# Patient Record
Sex: Female | Born: 1980 | Race: Black or African American | Hispanic: No | Marital: Married | Smoking: Never smoker
Health system: Southern US, Community
[De-identification: ages and names within clinical notes are randomized; demographics above are authoritative.]

## PROBLEM LIST (undated history)

## (undated) ENCOUNTER — Inpatient Hospital Stay (HOSPITAL_COMMUNITY): Payer: Self-pay

---

## 2003-04-23 ENCOUNTER — Emergency Department (HOSPITAL_COMMUNITY): Admission: EM | Admit: 2003-04-23 | Discharge: 2003-04-24 | Payer: Self-pay | Admitting: Emergency Medicine

## 2003-08-09 ENCOUNTER — Ambulatory Visit (HOSPITAL_COMMUNITY): Admission: RE | Admit: 2003-08-09 | Discharge: 2003-08-09 | Payer: Self-pay | Admitting: Certified Nurse Midwife

## 2003-10-05 ENCOUNTER — Inpatient Hospital Stay (HOSPITAL_COMMUNITY): Admission: AD | Admit: 2003-10-05 | Discharge: 2003-10-05 | Payer: Self-pay | Admitting: Obstetrics & Gynecology

## 2003-12-17 ENCOUNTER — Inpatient Hospital Stay (HOSPITAL_COMMUNITY): Admission: AD | Admit: 2003-12-17 | Discharge: 2003-12-17 | Payer: Self-pay | Admitting: Obstetrics & Gynecology

## 2004-01-07 ENCOUNTER — Observation Stay (HOSPITAL_COMMUNITY): Admission: AD | Admit: 2004-01-07 | Discharge: 2004-01-08 | Payer: Self-pay | Admitting: Obstetrics & Gynecology

## 2004-01-08 ENCOUNTER — Inpatient Hospital Stay (HOSPITAL_COMMUNITY): Admission: AD | Admit: 2004-01-08 | Discharge: 2004-01-11 | Payer: Self-pay | Admitting: Obstetrics & Gynecology

## 2004-09-09 ENCOUNTER — Emergency Department (HOSPITAL_COMMUNITY): Admission: EM | Admit: 2004-09-09 | Discharge: 2004-09-09 | Payer: Self-pay | Admitting: Emergency Medicine

## 2004-09-10 ENCOUNTER — Emergency Department (HOSPITAL_COMMUNITY): Admission: EM | Admit: 2004-09-10 | Discharge: 2004-09-10 | Payer: Self-pay | Admitting: Emergency Medicine

## 2004-09-12 ENCOUNTER — Ambulatory Visit (HOSPITAL_COMMUNITY): Admission: RE | Admit: 2004-09-12 | Discharge: 2004-09-12 | Payer: Self-pay | Admitting: Emergency Medicine

## 2005-04-03 ENCOUNTER — Emergency Department (HOSPITAL_COMMUNITY): Admission: EM | Admit: 2005-04-03 | Discharge: 2005-04-04 | Payer: Self-pay | Admitting: Emergency Medicine

## 2005-08-10 ENCOUNTER — Inpatient Hospital Stay (HOSPITAL_COMMUNITY): Admission: AD | Admit: 2005-08-10 | Discharge: 2005-08-10 | Payer: Self-pay | Admitting: Obstetrics & Gynecology

## 2005-09-20 ENCOUNTER — Emergency Department (HOSPITAL_COMMUNITY): Admission: EM | Admit: 2005-09-20 | Discharge: 2005-09-20 | Payer: Self-pay | Admitting: Emergency Medicine

## 2005-10-26 ENCOUNTER — Inpatient Hospital Stay (HOSPITAL_COMMUNITY): Admission: AD | Admit: 2005-10-26 | Discharge: 2005-10-27 | Payer: Self-pay | Admitting: Obstetrics

## 2005-11-05 ENCOUNTER — Inpatient Hospital Stay (HOSPITAL_COMMUNITY): Admission: AD | Admit: 2005-11-05 | Discharge: 2005-11-05 | Payer: Self-pay | Admitting: Obstetrics

## 2005-11-09 ENCOUNTER — Inpatient Hospital Stay (HOSPITAL_COMMUNITY): Admission: AD | Admit: 2005-11-09 | Discharge: 2005-11-12 | Payer: Self-pay | Admitting: Obstetrics & Gynecology

## 2007-01-27 ENCOUNTER — Ambulatory Visit (HOSPITAL_COMMUNITY): Admission: RE | Admit: 2007-01-27 | Discharge: 2007-01-27 | Payer: Self-pay | Admitting: Obstetrics & Gynecology

## 2007-04-11 ENCOUNTER — Inpatient Hospital Stay (HOSPITAL_COMMUNITY): Admission: AD | Admit: 2007-04-11 | Discharge: 2007-04-12 | Payer: Self-pay | Admitting: Obstetrics

## 2007-04-11 ENCOUNTER — Ambulatory Visit: Payer: Self-pay | Admitting: Gynecology

## 2007-04-18 ENCOUNTER — Inpatient Hospital Stay (HOSPITAL_COMMUNITY): Admission: AD | Admit: 2007-04-18 | Discharge: 2007-04-20 | Payer: Self-pay | Admitting: Family Medicine

## 2007-04-18 ENCOUNTER — Ambulatory Visit: Payer: Self-pay | Admitting: Family Medicine

## 2009-07-26 ENCOUNTER — Emergency Department (HOSPITAL_COMMUNITY): Admission: EM | Admit: 2009-07-26 | Discharge: 2009-07-26 | Payer: Self-pay | Admitting: Emergency Medicine

## 2009-07-29 ENCOUNTER — Emergency Department (HOSPITAL_COMMUNITY): Admission: EM | Admit: 2009-07-29 | Discharge: 2009-07-29 | Payer: Self-pay | Admitting: Emergency Medicine

## 2010-05-05 ENCOUNTER — Emergency Department (HOSPITAL_COMMUNITY): Admission: EM | Admit: 2010-05-05 | Discharge: 2010-05-06 | Payer: Self-pay | Admitting: Emergency Medicine

## 2010-05-06 ENCOUNTER — Ambulatory Visit: Payer: Self-pay | Admitting: Psychiatry

## 2010-05-06 ENCOUNTER — Inpatient Hospital Stay (HOSPITAL_COMMUNITY): Admission: EM | Admit: 2010-05-06 | Discharge: 2010-05-08 | Payer: Self-pay | Admitting: Psychiatry

## 2010-08-10 ENCOUNTER — Emergency Department (HOSPITAL_COMMUNITY): Admission: EM | Admit: 2010-08-10 | Discharge: 2010-08-10 | Payer: Self-pay | Admitting: Emergency Medicine

## 2010-09-20 ENCOUNTER — Emergency Department (HOSPITAL_COMMUNITY)
Admission: EM | Admit: 2010-09-20 | Discharge: 2010-09-20 | Payer: Self-pay | Source: Home / Self Care | Admitting: Emergency Medicine

## 2010-09-24 NOTE — L&D Delivery Note (Signed)
Delivery Note At 11:47 AM a viable female was delivered via Vaginal, Spontaneous Delivery (Presentation:  Vertex - Right Occiput Anterior).  APGAR: 9, 9; weight 7 lb 3.7 oz (3280 g).   Placenta status: , .  Cord:  with the following complications: None.    Anesthesia: Epidural  Episiotomy: None Lacerations: None Suture Repair: none Est. Blood Loss (mL):   Mom to postpartum.  Baby to nursery-stable.  Cleola Perryman A 05/06/2011, 12:20 PM

## 2010-12-04 LAB — POCT PREGNANCY, URINE: Preg Test, Ur: POSITIVE

## 2010-12-08 LAB — BASIC METABOLIC PANEL
BUN: 9 mg/dL (ref 6–23)
CO2: 23 mEq/L (ref 19–32)
Calcium: 9.3 mg/dL (ref 8.4–10.5)
Chloride: 109 mEq/L (ref 96–112)
Creatinine, Ser: 0.76 mg/dL (ref 0.4–1.2)
GFR calc Af Amer: >60 mL/min (ref >60)
GFR calc non Af Amer: >60 mL/min (ref >60)
Glucose, Bld: 90 mg/dL (ref 70–99)
Potassium: 3.9 mEq/L (ref 3.5–5.1)
Sodium: 140 mEq/L (ref 135–145)

## 2010-12-08 LAB — RAPID URINE DRUG SCREEN, HOSP PERFORMED
Amphetamines: NOT DETECTED
Barbiturates: NOT DETECTED
Benzodiazepines: POSITIVE — AB
Cocaine: NOT DETECTED
Opiates: NOT DETECTED
Tetrahydrocannabinol: NOT DETECTED

## 2010-12-08 LAB — DIFFERENTIAL
Basophils Absolute: 0 10*3/uL (ref 0.0–0.1)
Basophils Relative: 1 % (ref 0–1)
Eosinophils Absolute: 0.1 10*3/uL (ref 0.0–0.7)
Eosinophils Relative: 1 % (ref 0–5)
Lymphocytes Relative: 36 % (ref 12–46)
Lymphs Abs: 2.2 10*3/uL (ref 0.7–4.0)
Monocytes Absolute: 0.5 10*3/uL (ref 0.1–1.0)
Monocytes Relative: 8 % (ref 3–12)
Neutro Abs: 3.5 10*3/uL (ref 1.7–7.7)
Neutrophils Relative %: 55 % (ref 43–77)

## 2010-12-08 LAB — URINE MICROSCOPIC-ADD ON

## 2010-12-08 LAB — URINALYSIS, ROUTINE W REFLEX MICROSCOPIC
Bilirubin Urine: NEGATIVE
Glucose, UA: NEGATIVE mg/dL
Hgb urine dipstick: NEGATIVE
Ketones, ur: NEGATIVE mg/dL
Nitrite: NEGATIVE
Protein, ur: NEGATIVE mg/dL
Specific Gravity, Urine: 1.012 (ref 1.005–1.030)
Urobilinogen, UA: 0.2 mg/dL (ref 0.0–1.0)
pH: 6.5 (ref 5.0–8.0)

## 2010-12-08 LAB — CBC
HCT: 39.5 % (ref 36.0–46.0)
Hemoglobin: 13.6 g/dL (ref 12.0–15.0)
MCH: 31.1 pg (ref 26.0–34.0)
MCHC: 34.4 g/dL (ref 30.0–36.0)
MCV: 90.3 fL (ref 78.0–100.0)
Platelets: 253 10*3/uL (ref 150–400)
RBC: 4.38 MIL/uL (ref 3.87–5.11)
RDW: 12.6 % (ref 11.5–15.5)
WBC: 6.3 10*3/uL (ref 4.0–10.5)

## 2010-12-08 LAB — TRICYCLICS SCREEN, URINE: TCA Scrn: NOT DETECTED

## 2010-12-08 LAB — ACETAMINOPHEN LEVEL
Acetaminophen (Tylenol), Serum: <10 ug/mL — ABNORMAL LOW (ref 10–30)
Acetaminophen (Tylenol), Serum: <10 ug/mL — ABNORMAL LOW (ref 10–30)

## 2010-12-08 LAB — PREGNANCY, URINE: Preg Test, Ur: NEGATIVE

## 2010-12-08 LAB — SALICYLATE LEVEL: Salicylate Lvl: <4 mg/dL (ref 2.8–20.0)

## 2010-12-08 LAB — ETHANOL: Alcohol, Ethyl (B): <5 mg/dL (ref 0–10)

## 2010-12-27 LAB — DIFFERENTIAL
Basophils Absolute: 0 10*3/uL (ref 0.0–0.1)
Basophils Relative: 0 % (ref 0–1)
Lymphocytes Relative: 13 % (ref 12–46)
Neutro Abs: 3.4 10*3/uL (ref 1.7–7.7)
Neutrophils Relative %: 73 % (ref 43–77)

## 2010-12-27 LAB — CSF CELL COUNT WITH DIFFERENTIAL: WBC, CSF: 0 /mm3 (ref 0–5)

## 2010-12-27 LAB — CULTURE, BLOOD (ROUTINE X 2): Culture: NO GROWTH

## 2010-12-27 LAB — CBC
HCT: 39.5 % (ref 36.0–46.0)
Hemoglobin: 13.4 g/dL (ref 12.0–15.0)
MCHC: 33.5 g/dL (ref 30.0–36.0)
MCHC: 33.8 g/dL (ref 30.0–36.0)
MCV: 90 fL (ref 78.0–100.0)
Platelets: 199 10*3/uL (ref 150–400)
Platelets: 230 10*3/uL (ref 150–400)
RDW: 13.1 % (ref 11.5–15.5)
RDW: 13.6 % (ref 11.5–15.5)

## 2010-12-27 LAB — BASIC METABOLIC PANEL
BUN: 10 mg/dL (ref 6–23)
BUN: 6 mg/dL (ref 6–23)
CO2: 24 mEq/L (ref 19–32)
Calcium: 9 mg/dL (ref 8.4–10.5)
Creatinine, Ser: 0.6 mg/dL (ref 0.4–1.2)
GFR calc non Af Amer: >60 mL/min (ref >60)
GFR calc non Af Amer: >60 mL/min (ref >60)
Glucose, Bld: 121 mg/dL — ABNORMAL HIGH (ref 70–99)
Glucose, Bld: 95 mg/dL (ref 70–99)
Potassium: 4.1 mEq/L (ref 3.5–5.1)
Sodium: 137 mEq/L (ref 135–145)
Sodium: 137 mEq/L (ref 135–145)

## 2010-12-27 LAB — URINALYSIS, ROUTINE W REFLEX MICROSCOPIC
Bilirubin Urine: NEGATIVE
Ketones, ur: NEGATIVE mg/dL
Nitrite: NEGATIVE
Protein, ur: NEGATIVE mg/dL
Specific Gravity, Urine: 1.025 (ref 1.005–1.030)
Urobilinogen, UA: 1 mg/dL (ref 0.0–1.0)

## 2010-12-27 LAB — GRAM STAIN

## 2010-12-27 LAB — CSF CULTURE W GRAM STAIN: Gram Stain: NONE SEEN

## 2010-12-27 LAB — CK TOTAL AND CKMB (NOT AT ARMC): Relative Index: 0.6 (ref 0.0–2.5)

## 2010-12-27 LAB — PROTEIN AND GLUCOSE, CSF: Glucose, CSF: 66 mg/dL (ref 43–76)

## 2011-02-09 NOTE — Op Note (Signed)
NAMESHASHANA, Katherine Mcguire               ACCOUNT NO.:  [id number]   MEDICAL RECORD NO.:  [id number]          PATIENT TYPE:  INP   LOCATION:  9116                          FACILITY:  [facility]   PHYSICIAN:  Kathreen Cosier, M.D.DATE OF BIRTH:  03-Apr-1981   DATE OF PROCEDURE:  11/09/2005  DATE OF DISCHARGE:                                 OPERATIVE REPORT   PREOPERATIVE DIAGNOSIS:  Breech, in labor, at term.   ANESTHESIA:  Spinal.   SURGEON:  Kathreen Cosier, M.D.   DESCRIPTION OF PROCEDURE:  The patient placed on the operating table in the  supine position.  After spinal administered, abdomen prepped and draped.  Bladder emptied with Foley catheter.  Transverse suprapubic incision made,  carried down through the rectus fascia.  The fascia was cleaned and incised  the length of the incision.  Rectus muscle were retracted laterally.  The  peritoneum was incised longitudinally.  Transverse incision made in the  vesicouterine peritoneum above the bladder.  The bladder mobilized  inferiorly.  Transverse lower uterine incision made.  The fluid was clear.  The patient was delivered of a frank breech female.  The Apgars were 9 and  9, weighing 5 pounds 15 ounces.  Placenta was posterior, fundal.  Removed  manually and sent to labor and delivery.  Uterine cavity was cleaned with  dry laps.  Uterine incision closed in one layer with continuous suture of #1  chromic including myometrium and endometrium.  Bladder flap reattached with  2-0 chromic.  Uterus was well contracted.  Tubes and ovaries normal.  Abdomen closed in layers.  Peritoneum with continuous suture of 0 chromic,  fascia with continuous suture of 0 Dexon and skin closed with subcuticular  stitch of 4-0 Monocryl.  Blood loss 500 mL.           ______________________________  Kathreen Cosier, M.D.     BAM/MEDQ  D:  11/09/2005  T:  11/09/2005  Job:  [id number]

## 2011-02-09 NOTE — Discharge Summary (Signed)
Katherine Mcguire, Katherine Mcguire               ACCOUNT NO.:  [id number]   MEDICAL RECORD NO.:  [id number]          PATIENT TYPE:  INP   LOCATION:  9116                          FACILITY:  [facility]   PHYSICIAN:  Roseanna Rainbow, M.D.DATE OF BIRTH:  [birth year]   DATE OF ADMISSION:  11/09/2005  DATE OF DISCHARGE:  11/12/2005                                 DISCHARGE SUMMARY   CHIEF COMPLAINT:  The patient is a 30 year old gravida 3, para 1 with an  estimated date of confinement of February 28 complaining of contractions.   HISTORY OF PRESENT ILLNESS:  The patient was felt to be breech in the office  and was for a planned external cephalic version attempt.   PAST MEDICAL HISTORY:  Asthma.   PHYSICAL EXAMINATION:  VITAL SIGNS:  Stable and afebrile.  Fetal heart  tracing with occasional late decelerations.  GENERAL APPEARANCE:  A well-developed female in early labor.  ABDOMEN:  By Leopold's, breech presentation.  PELVIC EXAM:  The cervix is 1 cm dilated and long.   ASSESSMENT:  Early labor, breech presentation.   PLAN:  The plan was admission and a primary cesarean delivery.   HOSPITAL COURSE:  The patient was admitted and brought for a cesarean  delivery.  Please see the dictated operative summary as per Dr. Francoise Ceo.  Her postoperative course was uneventful.  She was discharged to  home on postoperative day #3.   DISCHARGE DIAGNOSES:  1.  Intrauterine pregnancy at term.  2.  Homero Fellers breech presentation.  3.  Early labor.   PROCEDURES:  Primary Low-uterine-flap-ellipitical Cesarean Delivery.   CONDITION:  Good.   DIET:  Regular.   ACTIVITY:  Progressive and pelvic rest.   MEDICATIONS:  Percocet, ibuprofen and prenatal vitamins.   DISPOSITION:  The patient was to follow up in the office in 2 weeks.      Roseanna Rainbow, M.D.  Electronically Signed     LAJ/MEDQ  D:  12/03/2005  T:  12/03/2005  Job:  [id number]

## 2011-03-19 LAB — TYPE AND SCREEN: Antibody Screen: NEGATIVE

## 2011-03-19 LAB — HEPATITIS B SURFACE ANTIGEN: Hepatitis B Surface Ag: NEGATIVE

## 2011-03-19 LAB — ABO/RH

## 2011-03-19 LAB — RUBELLA ANTIBODY, IGM: Rubella: IMMUNE

## 2011-03-19 LAB — RPR: RPR: NONREACTIVE

## 2011-04-10 ENCOUNTER — Inpatient Hospital Stay (HOSPITAL_COMMUNITY)
Admission: AD | Admit: 2011-04-10 | Discharge: 2011-04-11 | Disposition: A | Discharge status: Home / Self Care | Payer: Self-pay | Source: Ambulatory Visit | Attending: Obstetrics | Admitting: Obstetrics

## 2011-04-10 DIAGNOSIS — O368190 Decreased fetal movements, unspecified trimester, not applicable or unspecified: Secondary | ICD-10-CM

## 2011-04-11 ENCOUNTER — Encounter (HOSPITAL_COMMUNITY): Payer: Self-pay | Admitting: *Deleted

## 2011-04-11 NOTE — Progress Notes (Signed)
Pt presents to mau for decreased fetal movment.  Brought to room straight from lobby. Placed on efm and toco.  Fetal movement audible on [place].

## 2011-04-11 NOTE — ED Provider Notes (Signed)
Katherine Mcguire 29 y.o. G4P1203.[redacted]w[redacted]d with 1 d hx of decreased FM. She was very busy all day doing home improvements and felt little movement until arriving here and now feels lots of FM. No LOF or VB. No change in UCs x 2-3 wks. No LOF or VB   Filed Vitals:   04/11/11 0106  BP: 117/68  Pulse: 101  Temp:   Resp: 18   Gen: NAD Abd soft, NT, S=D Cx: soft, L/C/H, vtx  UCs q 6 in FHR: 140, Reactive, no decels  Bedside US by me: normal AFV, very active fetus   IMP: Fetus well by maternal perception of FM, EFM reactivity, and normal AFV.  Reassured. Explained kick counts. F/U next week as scheduled. Marland Kitchen

## 2011-04-11 NOTE — Progress Notes (Signed)
Katherine Mcguire, CNM at bedside. Strip reviewed and assessment done.  poc discussed with pt.

## 2011-04-13 ENCOUNTER — Encounter (HOSPITAL_COMMUNITY): Payer: Self-pay | Admitting: *Deleted

## 2011-04-13 ENCOUNTER — Inpatient Hospital Stay (HOSPITAL_COMMUNITY)
Admission: AD | Admit: 2011-04-13 | Discharge: 2011-04-13 | Disposition: A | Discharge status: Home / Self Care | Payer: Self-pay | Source: Ambulatory Visit | Attending: Obstetrics | Admitting: Obstetrics

## 2011-04-13 DIAGNOSIS — O99891 Other specified diseases and conditions complicating pregnancy: Secondary | ICD-10-CM | POA: Insufficient documentation

## 2011-04-13 DIAGNOSIS — R197 Diarrhea, unspecified: Secondary | ICD-10-CM | POA: Insufficient documentation

## 2011-04-13 DIAGNOSIS — Z3490 Encounter for supervision of normal pregnancy, unspecified, unspecified trimester: Secondary | ICD-10-CM

## 2011-04-13 DIAGNOSIS — R109 Unspecified abdominal pain: Secondary | ICD-10-CM | POA: Insufficient documentation

## 2011-04-13 LAB — URINALYSIS, ROUTINE W REFLEX MICROSCOPIC
Bilirubin Urine: NEGATIVE
Glucose, UA: NEGATIVE mg/dL
Hgb urine dipstick: NEGATIVE
Ketones, ur: NEGATIVE mg/dL
Nitrite: NEGATIVE
Specific Gravity, Urine: 1.02 (ref 1.005–1.030)
pH: 5.5 (ref 5.0–8.0)

## 2011-04-13 MED ORDER — DIPHENOXYLATE-ATROPINE 2.5-0.025 MG PO TABS
2.0000 | ORAL_TABLET | Freq: Once | ORAL | Status: AC
  Administered 2011-04-13: 2 via ORAL
  Filled 2011-04-13: qty 2

## 2011-04-13 MED ORDER — DIPHENOXYLATE-ATROPINE 2.5-0.025 MG PO TABS: 1.0000 | ORAL_TABLET | Freq: Four times a day (QID) | ORAL | Status: AC | PRN

## 2011-04-13 NOTE — ED Provider Notes (Signed)
History     Chief Complaint  Patient presents with  . Abdominal Pain    pt states she had 16 loose stools today-   HPI 30 y.o. [id number] @ [redacted]w[redacted]d presents c/o diarrhea and abdominal pain since 11 AM. Abdominal pain comes about every 10 mintues, patient states it does not feel as painful as contractions. Diarrhea comes following some of the pains, has happened between 13 and 16 times today. Since diarrhea started, has only eaten cherries and grapes and has been drinking cranberry juice. + fetal movement, no vag bleeding or LOF. Prenatal care with Dr. Gaynell Face, uncomplicated prenatal course per patient. H/O SVD with first delivery, C/S for breech with 2nd, SVD 3rd.   OB History    Grav Para Term Preterm Abortions TAB SAB Ect Mult Living   4 3 1 2    0   3      Past Medical History  Diagnosis Date  . Asthma     Past Surgical History  Procedure Date  . Cesarean section     No family history on file.  History  Substance Use Topics  . Smoking status: Never Smoker   . Smokeless tobacco: Not on file  . Alcohol Use: No    Allergies: No Known Allergies  Prescriptions prior to admission  Medication Sig Dispense Refill  . prenatal vitamin w/FE, FA (PRENATAL 1 + 1) 27-1 MG TABS Take 1 tablet by mouth daily.          Review of Systems  Constitutional: Negative.  Negative for fever and chills.  HENT: Negative.   Respiratory: Negative.   Cardiovascular: Negative.   Gastrointestinal: Positive for abdominal pain and diarrhea. Negative for nausea and vomiting.  Genitourinary: Negative.        Negative for vaginal bleeding, leaking fluid  Musculoskeletal: Negative.   Neurological: Negative.   Psychiatric/Behavioral: Negative.    Physical Exam   Blood pressure 96/68, pulse 98, temperature 98.6 F (37 C), temperature source Oral, resp. rate 18, height 5\' 4"  (1.626 m), weight 67.767 kg (149 lb 6.4 oz). EFM: 130s, mod variability, + accels TOCO: irreg, q4-10 minutes on initial  assessment Physical Exam  Nursing note and vitals reviewed. Constitutional: She is oriented to person, place, and time. She appears well-developed and well-nourished. No distress.  HENT:  Head: Normocephalic and atraumatic.  Cardiovascular: Normal rate.   Respiratory: Effort normal.  GI: Soft. Bowel sounds are normal. She exhibits no mass. There is no tenderness. There is no rebound and no guarding.  Genitourinary: There is no rash or lesion on the right labia. There is no rash or lesion on the left labia. Uterus is not tender. Enlarged: Size c/w dates.       SVE: 1/thick/high/posterior  Musculoskeletal: Normal range of motion.  Neurological: She is alert and oriented to person, place, and time.  Skin: Skin is warm and dry.  Psychiatric: She has a normal mood and affect.    MAU Course  Procedures Lomotil given for diarrhea, will continue to monitor at this time d/t contractions  One episode of diarrhea upon arrival to MAU, none during stay. Contractions spaced out and became more irregular.   Assessment and Plan  30 yo [id number] @ [redacted]w[redacted]d Diarrhea - rx lomotil, encouraged PO hydration Threatened preterm labor - rev'd precautions Pt to f/u as scheduled or sooner if symptoms worsen or with signs of labor  Julaine Zimny 04/13/2011, 3:23 AM

## 2011-04-13 NOTE — Progress Notes (Signed)
Pt relaxed and drowsey-remains relaxed with u/c's-no diarrhea present since she has been here

## 2011-04-13 NOTE — Progress Notes (Signed)
Pt states she has "terrible diarrhea" more than 12x.  Generalized abd pain.  36wks....g4p3

## 2011-04-17 LAB — STREP B DNA PROBE: GBS: NEGATIVE

## 2011-04-24 ENCOUNTER — Other Ambulatory Visit: Payer: Self-pay

## 2011-04-24 ENCOUNTER — Inpatient Hospital Stay (HOSPITAL_COMMUNITY)
Admission: AD | Admit: 2011-04-24 | Discharge: 2011-04-24 | Disposition: A | Discharge status: Home / Self Care | Payer: Self-pay | Source: Ambulatory Visit | Attending: Obstetrics | Admitting: Obstetrics

## 2011-04-24 ENCOUNTER — Encounter (HOSPITAL_COMMUNITY): Payer: Self-pay | Admitting: *Deleted

## 2011-04-24 DIAGNOSIS — R002 Palpitations: Secondary | ICD-10-CM | POA: Insufficient documentation

## 2011-04-24 DIAGNOSIS — J45909 Unspecified asthma, uncomplicated: Secondary | ICD-10-CM

## 2011-04-24 DIAGNOSIS — J45901 Unspecified asthma with (acute) exacerbation: Secondary | ICD-10-CM | POA: Insufficient documentation

## 2011-04-24 DIAGNOSIS — O9989 Other specified diseases and conditions complicating pregnancy, childbirth and the puerperium: Secondary | ICD-10-CM | POA: Insufficient documentation

## 2011-04-24 DIAGNOSIS — R0602 Shortness of breath: Secondary | ICD-10-CM | POA: Insufficient documentation

## 2011-04-24 DIAGNOSIS — J069 Acute upper respiratory infection, unspecified: Secondary | ICD-10-CM | POA: Insufficient documentation

## 2011-04-24 MED ORDER — BENZONATATE 100 MG PO CAPS: 100.0000 mg | ORAL_CAPSULE | Freq: Three times a day (TID) | ORAL | Status: AC

## 2011-04-24 NOTE — Progress Notes (Signed)
Pt reports that she has not eaten since about midnight and only has a some juice today around noon. But did drink some water in lobby.

## 2011-04-24 NOTE — Progress Notes (Signed)
Pt reports the that she has had aq cold for 2 day. Pt states that she was instructed to take robatussin, took "alka seltzer plus cold" instead and has had palpitations since last night. Pt states that she used her inhaler just before 1800. Pt denies shortness of breath at this time.

## 2011-04-24 NOTE — Progress Notes (Signed)
Had cold yesterday, used inhaler.  Was advised to take robitussin by MD, unavailable at store.  Instead pt took alka-seltzer plus, had heart palpitations all night.  Used inhaler several times today.  Bought robitussin today, had palpitations again, feeling dizzy.  Pt used inhaler again in lobby.

## 2011-04-24 NOTE — ED Provider Notes (Signed)
History   Pt presents today c/o asthma attack. She states she has used her inhaler multiple times but now is feeling better. Her main complaint is heart palpitations after using her inhaler. She reports GFM and denies vag dc, bleeding, or any other sx at this time.  Chief Complaint  Patient presents with  . Palpitations   HPI  OB History    Grav Para Term Preterm Abortions TAB SAB Ect Mult Living   4 3 1 2    0   3      Past Medical History  Diagnosis Date  . Asthma     Past Surgical History  Procedure Date  . Cesarean section     No family history on file.  History  Substance Use Topics  . Smoking status: Never Smoker   . Smokeless tobacco: Not on file  . Alcohol Use: No    Allergies: No Known Allergies  Prescriptions prior to admission  Medication Sig Dispense Refill  . Albuterol Sulfate (VENTOLIN HFA IN) Inhale 2 puffs into the lungs every 4 (four) hours as needed. As needed for athsma       . Diphenhydramine-PE-APAP (ROBITUSSIN PEAK COLD MULTI-SYM) 6.25-2.5-160 MG/5ML LIQD Take 10 mLs by mouth every 4 (four) hours as needed. As needed for cough/congestion       . OVER THE COUNTER MEDICATION Take 2 tablets by mouth every 4 (four) hours as needed. As needed for cough/congestion       . prenatal vitamin w/FE, FA (PRENATAL 1 + 1) 27-1 MG TABS Take 1 tablet by mouth daily.       . Pseudoephedrine-Acetaminophen (ALKA-SELTZER PLUS COLD/SINUS PO) Take 2 capsules by mouth every 4 (four) hours as needed. As needed for cough/congestion       . diphenoxylate-atropine (LOMOTIL) 2.5-0.025 MG per tablet Take 1 tablet by mouth 4 (four) times daily as needed for diarrhea/loose stools.  30 tablet  0    Review of Systems  Constitutional: Negative for fever and chills.  Respiratory: Positive for shortness of breath. Negative for cough, hemoptysis, sputum production and wheezing.   Cardiovascular: Positive for palpitations. Negative for chest pain, orthopnea, claudication and leg  swelling.  Gastrointestinal: Negative for nausea, vomiting, abdominal pain, diarrhea and constipation.  Genitourinary: Negative for dysuria, urgency, frequency, hematuria and flank pain.  Neurological: Negative for dizziness and headaches.  Psychiatric/Behavioral: Negative for depression and suicidal ideas.   Physical Exam   Blood pressure 116/64, pulse 120, temperature 98.1 F (36.7 C), temperature source Oral, resp. rate 24, height 5\' 5"  (1.651 m), weight 151 lb 3.2 oz (68.584 kg), SpO2 97.00%.  Physical Exam  Constitutional: She is oriented to person, place, and time. She appears well-developed and well-nourished. No distress.  HENT:  Head: Normocephalic and atraumatic.  Eyes: EOM are normal. Pupils are equal, round, and reactive to light.  Cardiovascular: Normal rate and regular rhythm.  Exam reveals no gallop and no friction rub.   Murmur heard. Respiratory: Effort normal and breath sounds normal. No respiratory distress. She has no wheezes. She has no rales. She exhibits no tenderness.  GI: Soft. She exhibits no distension. There is no tenderness. There is no rebound and no guarding.  Neurological: She is alert and oriented to person, place, and time.  Skin: Skin is warm and dry. She is not diaphoretic.  Psychiatric: She has a normal mood and affect. Her behavior is normal. Judgment and thought content normal.    MAU Course  Procedures  ECG shows sinus tachycardia  and nonspecific T wave abnormality. ECG also reviewed by Dr. Orvan Falconer.  Assessment and Plan  Asthma attack: pt currently stable. She will f/u with Dr. Gaynell Face. Discussed diet, activity, risks, and precautions. Reminded of [facility]. Discussed signs and sx of labor. Will give Rx for tessalon to help with prn cough.  Katherine Mcguire. Katherine Mcguire III, DrHSc, MPAS, PA-C  04/24/2011, 8:22 PM   Henrietta Hoover, PA 04/24/11 2106

## 2011-05-02 ENCOUNTER — Encounter (HOSPITAL_COMMUNITY): Payer: Self-pay | Admitting: *Deleted

## 2011-05-02 ENCOUNTER — Inpatient Hospital Stay (HOSPITAL_COMMUNITY)
Admission: AD | Admit: 2011-05-02 | Discharge: 2011-05-02 | Disposition: A | Discharge status: Home / Self Care | Payer: Self-pay | Source: Ambulatory Visit | Attending: Obstetrics | Admitting: Obstetrics

## 2011-05-02 NOTE — Progress Notes (Signed)
Pain since earlier today, comes and goes, but past 3 hrs keeps coming

## 2011-05-06 ENCOUNTER — Encounter (HOSPITAL_COMMUNITY): Payer: Self-pay | Admitting: *Deleted

## 2011-05-06 ENCOUNTER — Inpatient Hospital Stay (HOSPITAL_COMMUNITY)
Admission: AD | Admit: 2011-05-06 | Discharge: 2011-05-08 | DRG: 775 | Disposition: A | Discharge status: Home / Self Care | Payer: Medicaid Other | Source: Ambulatory Visit | Attending: Obstetrics | Admitting: Obstetrics

## 2011-05-06 ENCOUNTER — Encounter (HOSPITAL_COMMUNITY): Payer: Self-pay | Admitting: Anesthesiology

## 2011-05-06 ENCOUNTER — Inpatient Hospital Stay (HOSPITAL_COMMUNITY): Payer: Medicaid Other | Admitting: Anesthesiology

## 2011-05-06 DIAGNOSIS — Z3490 Encounter for supervision of normal pregnancy, unspecified, unspecified trimester: Secondary | ICD-10-CM

## 2011-05-06 DIAGNOSIS — IMO0001 Reserved for inherently not codable concepts without codable children: Secondary | ICD-10-CM

## 2011-05-06 LAB — CBC
HCT: 32.4 % — ABNORMAL LOW (ref 36.0–46.0)
Hemoglobin: 10.1 g/dL — ABNORMAL LOW (ref 12.0–15.0)
MCHC: 31.2 g/dL (ref 30.0–36.0)
RDW: 16.9 % — ABNORMAL HIGH (ref 11.5–15.5)
WBC: 9.2 10*3/uL (ref 4.0–10.5)

## 2011-05-06 LAB — RPR: RPR Ser Ql: NONREACTIVE

## 2011-05-06 MED ORDER — FENTANYL 2.5 MCG/ML BUPIVACAINE 1/10 % EPIDURAL INFUSION (WH - ANES)
14.0000 mL/h | INTRAMUSCULAR | Status: DC
  Administered 2011-05-06 (×2): 14 mL/h via EPIDURAL
  Filled 2011-05-06: qty 60

## 2011-05-06 MED ORDER — OXYTOCIN 20 UNITS IN LACTATED RINGERS INFUSION - SIMPLE
1.0000 m[IU]/min | INTRAVENOUS | Status: DC
Start: 2011-05-06 — End: 2011-05-06
  Administered 2011-05-06: 1 m[IU]/min via INTRAVENOUS

## 2011-05-06 MED ORDER — ONDANSETRON HCL 4 MG PO TABS: 4.0000 mg | ORAL_TABLET | ORAL | Status: DC | PRN

## 2011-05-06 MED ORDER — PHENYLEPHRINE 40 MCG/ML (10ML) SYRINGE FOR IV PUSH (FOR BLOOD PRESSURE SUPPORT): 80.0000 ug | PREFILLED_SYRINGE | INTRAVENOUS | Status: DC | PRN

## 2011-05-06 MED ORDER — FENTANYL 2.5 MCG/ML BUPIVACAINE 1/10 % EPIDURAL INFUSION (WH - ANES)
INTRAMUSCULAR | Status: AC
  Filled 2011-05-06: qty 60

## 2011-05-06 MED ORDER — LACTATED RINGERS IV SOLN
INTRAVENOUS | Status: DC
  Administered 2011-05-06: 06:00:00 via INTRAVENOUS

## 2011-05-06 MED ORDER — OXYTOCIN BOLUS FROM INFUSION
500.0000 mL | Freq: Once | INTRAVENOUS | Status: DC
  Filled 2011-05-06: qty 500

## 2011-05-06 MED ORDER — MEASLES, MUMPS & RUBELLA VAC ~~LOC~~ INJ
0.5000 mL | INJECTION | Freq: Once | SUBCUTANEOUS | Status: DC
  Filled 2011-05-06: qty 0.5

## 2011-05-06 MED ORDER — OXYTOCIN 20 UNITS IN LACTATED RINGERS INFUSION - SIMPLE: 125.0000 mL/h | INTRAVENOUS | Status: DC | PRN

## 2011-05-06 MED ORDER — PRENATAL PLUS 27-1 MG PO TABS
1.0000 | ORAL_TABLET | Freq: Every day | ORAL | Status: DC
  Administered 2011-05-07 – 2011-05-08 (×2): 1 via ORAL
  Filled 2011-05-06 (×2): qty 1

## 2011-05-06 MED ORDER — OXYCODONE-ACETAMINOPHEN 5-325 MG PO TABS
1.0000 | ORAL_TABLET | ORAL | Status: DC | PRN
  Administered 2011-05-07 – 2011-05-08 (×5): 1 via ORAL
  Filled 2011-05-06 (×5): qty 1

## 2011-05-06 MED ORDER — TERBUTALINE SULFATE 1 MG/ML IJ SOLN: 0.2500 mg | Freq: Once | INTRAMUSCULAR | Status: DC | PRN

## 2011-05-06 MED ORDER — EPHEDRINE 5 MG/ML INJ: 10.0000 mg | INTRAVENOUS | Status: DC | PRN

## 2011-05-06 MED ORDER — OXYTOCIN 20 UNITS IN LACTATED RINGERS INFUSION - SIMPLE
125.0000 mL/h | INTRAVENOUS | Status: DC
  Filled 2011-05-06: qty 1000

## 2011-05-06 MED ORDER — HYDROXYZINE HCL 50 MG/ML IM SOLN
50.0000 mg | Freq: Four times a day (QID) | INTRAMUSCULAR | Status: DC | PRN
  Filled 2011-05-06: qty 1

## 2011-05-06 MED ORDER — LANOLIN HYDROUS EX OINT: TOPICAL_OINTMENT | CUTANEOUS | Status: DC | PRN

## 2011-05-06 MED ORDER — LIDOCAINE HCL 1.5 % IJ SOLN
INTRAMUSCULAR | Status: DC | PRN
  Administered 2011-05-06 (×2): 4 mL via EPIDURAL

## 2011-05-06 MED ORDER — METHYLERGONOVINE MALEATE 0.2 MG PO TABS: 0.2000 mg | ORAL_TABLET | ORAL | Status: DC | PRN

## 2011-05-06 MED ORDER — NALBUPHINE SYRINGE 5 MG/0.5 ML
10.0000 mg | INJECTION | Freq: Four times a day (QID) | INTRAMUSCULAR | Status: DC
  Filled 2011-05-06 (×4): qty 1

## 2011-05-06 MED ORDER — PHENYLEPHRINE 40 MCG/ML (10ML) SYRINGE FOR IV PUSH (FOR BLOOD PRESSURE SUPPORT)
PREFILLED_SYRINGE | INTRAVENOUS | Status: AC
  Filled 2011-05-06: qty 5

## 2011-05-06 MED ORDER — EPHEDRINE 5 MG/ML INJ
INTRAVENOUS | Status: AC
  Filled 2011-05-06: qty 4

## 2011-05-06 MED ORDER — OXYCODONE-ACETAMINOPHEN 5-325 MG PO TABS: 2.0000 | ORAL_TABLET | ORAL | Status: DC | PRN

## 2011-05-06 MED ORDER — OXYTOCIN 10 UNIT/ML IJ SOLN
INTRAMUSCULAR | Status: AC
  Administered 2011-05-06: 10 [IU]
  Filled 2011-05-06: qty 2

## 2011-05-06 MED ORDER — TETANUS-DIPHTH-ACELL PERTUSSIS 5-2.5-18.5 LF-MCG/0.5 IM SUSP
0.5000 mL | Freq: Once | INTRAMUSCULAR | Status: AC
  Administered 2011-05-08: 0.5 mL via INTRAMUSCULAR
  Filled 2011-05-06: qty 0.5

## 2011-05-06 MED ORDER — BENZOCAINE-MENTHOL 20-0.5 % EX AERO: 1.0000 "application " | INHALATION_SPRAY | CUTANEOUS | Status: DC | PRN

## 2011-05-06 MED ORDER — ZOLPIDEM TARTRATE 5 MG PO TABS: 5.0000 mg | ORAL_TABLET | Freq: Every evening | ORAL | Status: DC | PRN

## 2011-05-06 MED ORDER — LIDOCAINE HCL (PF) 1 % IJ SOLN: 30.0000 mL | INTRAMUSCULAR | Status: DC | PRN

## 2011-05-06 MED ORDER — DIPHENHYDRAMINE HCL 50 MG/ML IJ SOLN: 12.5000 mg | INTRAMUSCULAR | Status: DC | PRN

## 2011-05-06 MED ORDER — ONDANSETRON HCL 4 MG/2ML IJ SOLN: 4.0000 mg | INTRAMUSCULAR | Status: DC | PRN

## 2011-05-06 MED ORDER — DIPHENHYDRAMINE HCL 25 MG PO CAPS: 25.0000 mg | ORAL_CAPSULE | Freq: Four times a day (QID) | ORAL | Status: DC | PRN

## 2011-05-06 MED ORDER — SENNOSIDES-DOCUSATE SODIUM 8.6-50 MG PO TABS
2.0000 | ORAL_TABLET | Freq: Every day | ORAL | Status: DC
  Administered 2011-05-06 – 2011-05-07 (×2): 2 via ORAL

## 2011-05-06 MED ORDER — METHYLERGONOVINE MALEATE 0.2 MG/ML IJ SOLN: 0.2000 mg | INTRAMUSCULAR | Status: DC | PRN

## 2011-05-06 MED ORDER — ONDANSETRON HCL 4 MG/2ML IJ SOLN: 4.0000 mg | Freq: Four times a day (QID) | INTRAMUSCULAR | Status: DC | PRN

## 2011-05-06 MED ORDER — LACTATED RINGERS IV SOLN: 500.0000 mL | Freq: Once | INTRAVENOUS | Status: DC

## 2011-05-06 MED ORDER — MEDROXYPROGESTERONE ACETATE 150 MG/ML IM SUSP: 150.0000 mg | INTRAMUSCULAR | Status: DC | PRN

## 2011-05-06 MED ORDER — IBUPROFEN 600 MG PO TABS: 600.0000 mg | ORAL_TABLET | Freq: Four times a day (QID) | ORAL | Status: DC | PRN

## 2011-05-06 MED ORDER — IBUPROFEN 600 MG PO TABS
600.0000 mg | ORAL_TABLET | Freq: Four times a day (QID) | ORAL | Status: DC
  Administered 2011-05-06 – 2011-05-08 (×8): 600 mg via ORAL
  Filled 2011-05-06 (×8): qty 1

## 2011-05-06 MED ORDER — FLEET ENEMA 7-19 GM/118ML RE ENEM: 1.0000 | ENEMA | RECTAL | Status: DC | PRN

## 2011-05-06 MED ORDER — SODIUM CHLORIDE 0.9 % IV SOLN
1.0000 g | Freq: Four times a day (QID) | INTRAVENOUS | Status: DC
  Administered 2011-05-06: 1 g via INTRAVENOUS
  Filled 2011-05-06 (×2): qty 1000

## 2011-05-06 MED ORDER — SIMETHICONE 80 MG PO CHEW: 80.0000 mg | CHEWABLE_TABLET | ORAL | Status: DC | PRN

## 2011-05-06 MED ORDER — NALBUPHINE SYRINGE 5 MG/0.5 ML
10.0000 mg | INJECTION | INTRAMUSCULAR | Status: DC
  Filled 2011-05-06 (×12): qty 1

## 2011-05-06 MED ORDER — ACETAMINOPHEN 325 MG PO TABS: 650.0000 mg | ORAL_TABLET | ORAL | Status: DC | PRN

## 2011-05-06 MED ORDER — AMPICILLIN SODIUM 2 G IJ SOLR
2.0000 g | Freq: Four times a day (QID) | INTRAMUSCULAR | Status: DC
  Filled 2011-05-06 (×2): qty 2000

## 2011-05-06 MED ORDER — DIBUCAINE 1 % RE OINT: 1.0000 "application " | TOPICAL_OINTMENT | RECTAL | Status: DC | PRN

## 2011-05-06 MED ORDER — OXYTOCIN 10 UNIT/ML IJ SOLN
10.0000 [IU] | Freq: Once | INTRAMUSCULAR | Status: AC
  Administered 2011-05-06: 10 [IU]

## 2011-05-06 MED ORDER — LACTATED RINGERS IV SOLN: 500.0000 mL | INTRAVENOUS | Status: DC | PRN

## 2011-05-06 MED ORDER — WITCH HAZEL-GLYCERIN EX PADS: 1.0000 "application " | MEDICATED_PAD | CUTANEOUS | Status: DC | PRN

## 2011-05-06 MED ORDER — CITRIC ACID-SODIUM CITRATE 334-500 MG/5ML PO SOLN: 30.0000 mL | ORAL | Status: DC | PRN

## 2011-05-06 NOTE — H&P (Signed)
Katherine Mcguire is a 30 y.o. female presenting for active labor.. Maternal Medical History:  Reason for admission: Reason for admission: contractions.  Contractions: Onset was 3-5 hours ago.   Frequency: regular.   Duration is approximately 1 minute.   Perceived severity is strong.    Fetal activity: Perceived fetal activity is normal.   Last perceived fetal movement was within the past hour.      OB History    Grav Para Term Preterm Abortions TAB SAB Ect Mult Living   4 3 1 2    0   3     Past Medical History  Diagnosis Date  . Asthma    Past Surgical History  Procedure Date  . Cesarean section    Family History: family history is not on file. Social History:  reports that she has never smoked. She does not have any smokeless tobacco history on file. She reports that she does not drink alcohol or use illicit drugs.  Review of Systems  All other systems reviewed and are negative.    Dilation: 7 Effacement (%): 90 Station: -2 Exam by:: Rolitta, rn Blood pressure 117/63, pulse 90, temperature 97.8 F (36.6 C), temperature source Oral, height 5\' 5"  (1.651 m), weight 68.04 kg (150 lb), SpO2 100.00%. Maternal Exam:  Uterine Assessment: Contraction strength is firm.  Contraction duration is 1 minute. Contraction frequency is regular.   Abdomen: Patient reports no abdominal tenderness. Fetal presentation: vertex  Introitus: Normal vulva. Normal vagina.  Pelvis: adequate for delivery.   Cervix: Cervix evaluated by digital exam.     Physical Exam  Constitutional: She is oriented to person, place, and time. She appears well-developed and well-nourished.  HENT:  Head: Normocephalic and atraumatic.  Eyes: Conjunctivae and EOM are normal. Pupils are equal, round, and reactive to light.  Neck: Normal range of motion. Neck supple.  Cardiovascular: Normal rate.   Respiratory: Effort normal.  GI: Soft.  Genitourinary: Vagina normal and uterus normal.  Musculoskeletal: Normal  range of motion.  Neurological: She is alert and oriented to person, place, and time. She has normal reflexes.  Skin: Skin is warm and dry.  Psychiatric: She has a normal mood and affect. Her behavior is normal. Judgment and thought content normal.    Prenatal labs: ABO, Rh:   Antibody: Negative (06/25 0000) Rubella:   RPR: Nonreactive (06/25 0000)  HBsAg: Negative (06/25 0000)  HIV: Non-reactive (06/25 0000)  GBS: Negative (07/24 0000)   Assessment/Plan: Term pregnancy.  Active labor.   Emmylou Bieker A 05/06/2011, 5:51 AM    ac

## 2011-05-06 NOTE — Progress Notes (Signed)
Katherine Mcguire is a 30 y.o. [id number] at [redacted]w[redacted]d by LMP admitted for active labor  Subjective:   Objective: BP 117/63  Pulse 90  Temp(Src) 97.8 F (36.6 C) (Oral)  Ht 5\' 5"  (1.651 m)  Wt 68.04 kg (150 lb)  BMI 24.96 kg/m2  SpO2 100%      FHT:  FHR: 150 bpm, variability: moderate,  accelerations:  Present,  decelerations:  Absent UC:   regular, every 3 minutes SVE:   Dilation: 7 Effacement (%): 100 Station: -2 Exam by:: Prudence Davidson, RNC-OB  Labs: Lab Results  Component Value Date   WBC 9.2 05/06/2011   HGB 10.1* 05/06/2011   HCT 32.4* 05/06/2011   MCV 77.3* 05/06/2011   PLT 298 05/06/2011    Assessment / Plan: Spontaneous labor, progressing normally  Labor: Progressing normally Preeclampsia:  n/a Fetal Wellbeing:  Category I Pain Control:  Epidural I/D:  n/a Anticipated MOD:  NSVD  HARPER,CHARLES A 05/06/2011, 6:05 AM

## 2011-05-06 NOTE — Plan of Care (Signed)
Problem: Consults Goal: Birthing Suites Patient Information Press F2 to bring up selections list  Outcome: Completed/Met Date Met:  05/06/11  Pt 37-[redacted] weeks EGA     

## 2011-05-06 NOTE — Progress Notes (Signed)
Pt transferred to MAU RM #5 via WC. Pt states, "I started hurting around 0100.  Now they are very close."

## 2011-05-06 NOTE — Anesthesia Procedure Notes (Signed)
Epidural Patient location during procedure: OB Start time: 05/06/2011 5:35 AM  Staffing Anesthesiologist: Leesha Veno A. Performed by: anesthesiologist   Preanesthetic Checklist Completed: patient identified, site marked, surgical consent, pre-op evaluation, timeout performed, IV checked, risks and benefits discussed and monitors and equipment checked  Epidural Patient position: sitting Prep: site prepped and draped and DuraPrep Patient monitoring: continuous pulse ox and blood pressure Approach: midline Injection technique: LOR air  Needle:  Needle type: Tuohy  Needle gauge: 17 G Needle length: 9 cm Needle insertion depth: 5 cm cm Catheter type: closed end flexible Catheter size: 19 Gauge Catheter at skin depth: 10 cm Test dose: negative and 1.5% lidocaine  Assessment Events: blood not aspirated, injection not painful, no injection resistance, negative IV test and no paresthesia  Additional Notes Patient is more comfortable after epidural dosed. Please see RN's note for documentation of vital signs and FHR which are stable.

## 2011-05-06 NOTE — Progress Notes (Signed)
Spontaneous vaginal delivery of viable female see delivery record

## 2011-05-06 NOTE — Anesthesia Preprocedure Evaluation (Signed)
Anesthesia Evaluation  Name, MR# and DOB Patient awake  General Assessment Comment  Reviewed: Allergy & Precautions, H&P , NPO status , Patient's Chart, lab work & pertinent test results  Airway  TM Distance: >3 FB Neck ROM: full    Dental No notable dental hx. (+) Teeth Intact   Pulmonary  clear to auscultation  pulmonary exam normalPulmonary Exam Normal breath sounds clear to auscultation none    Cardiovascular regular Normal    Neuro/Psych Negative Neurological ROS  Negative Psych ROS  GI/Hepatic/Renal negative GI ROS, negative Liver ROS, and negative Renal ROS (+)       Endo/Other  Negative Endocrine ROS (+)      Abdominal   Musculoskeletal   Hematology negative hematology ROS (+)   Peds  Reproductive/Obstetrics (+) Pregnancy    Anesthesia Other Findings             Anesthesia Physical Anesthesia Plan  ASA: II  Anesthesia Plan: Epidural   Post-op Pain Management:    Induction:   Airway Management Planned:   Additional Equipment:   Intra-op Plan:   Post-operative Plan:   Informed Consent: I have reviewed the patients History and Physical, chart, labs and discussed the procedure including the risks, benefits and alternatives for the proposed anesthesia with the patient or authorized representative who has indicated his/her understanding and acceptance.     Plan Discussed with: Anesthesiologist  Anesthesia Plan Comments:         Anesthesia Quick Evaluation

## 2011-05-07 LAB — CBC
Platelets: 298 10*3/uL (ref 150–400)
RBC: 3.58 MIL/uL — ABNORMAL LOW (ref 3.87–5.11)
RDW: 17 % — ABNORMAL HIGH (ref 11.5–15.5)
WBC: 12.6 10*3/uL — ABNORMAL HIGH (ref 4.0–10.5)

## 2011-05-07 NOTE — Progress Notes (Signed)
Postpartum day #1 Vital signs normal Fundus firm Lochia moderate Legs negative No complaints   Postpartum day #1 Vital signs normal Fundus firm Lochia moderate Legs negative No complaints

## 2011-05-07 NOTE — Progress Notes (Signed)
UR chart review completed.  

## 2011-05-08 MED ORDER — SENNOSIDES-DOCUSATE SODIUM 8.6-50 MG PO TABS: 2.0000 | ORAL_TABLET | Freq: Every day | ORAL | Status: DC

## 2011-05-08 MED ORDER — DIBUCAINE 1 % RE OINT: 1.0000 "application " | TOPICAL_OINTMENT | RECTAL | Status: DC | PRN

## 2011-05-08 MED ORDER — PRENATAL PLUS 27-1 MG PO TABS: 1.0000 | ORAL_TABLET | Freq: Every day | ORAL | Status: DC

## 2011-05-08 MED ORDER — ONDANSETRON HCL 4 MG/2ML IJ SOLN: 4.0000 mg | INTRAMUSCULAR | Status: DC | PRN

## 2011-05-08 MED ORDER — OXYCODONE-ACETAMINOPHEN 5-325 MG PO TABS: 1.0000 | ORAL_TABLET | ORAL | Status: DC | PRN

## 2011-05-08 MED ORDER — DIPHENHYDRAMINE HCL 25 MG PO CAPS: 25.0000 mg | ORAL_CAPSULE | Freq: Four times a day (QID) | ORAL | Status: DC | PRN

## 2011-05-08 MED ORDER — BENZOCAINE-MENTHOL 20-0.5 % EX AERO: 1.0000 "application " | INHALATION_SPRAY | CUTANEOUS | Status: DC | PRN

## 2011-05-08 MED ORDER — SODIUM CHLORIDE 0.9 % IV SOLN: 250.0000 mL | INTRAVENOUS | Status: DC

## 2011-05-08 MED ORDER — TETANUS-DIPHTH-ACELL PERTUSSIS 5-2.5-18.5 LF-MCG/0.5 IM SUSP: 0.5000 mL | Freq: Once | INTRAMUSCULAR | Status: DC

## 2011-05-08 MED ORDER — FERROUS SULFATE 325 (65 FE) MG PO TABS: 325.0000 mg | ORAL_TABLET | Freq: Two times a day (BID) | ORAL | Status: DC

## 2011-05-08 MED ORDER — WITCH HAZEL-GLYCERIN EX PADS: 1.0000 "application " | MEDICATED_PAD | CUTANEOUS | Status: DC | PRN

## 2011-05-08 MED ORDER — IBUPROFEN 600 MG PO TABS: 600.0000 mg | ORAL_TABLET | Freq: Four times a day (QID) | ORAL | Status: DC

## 2011-05-08 MED ORDER — LANOLIN HYDROUS EX OINT: 1.0000 "application " | TOPICAL_OINTMENT | CUTANEOUS | Status: DC | PRN

## 2011-05-08 MED ORDER — SODIUM CHLORIDE 0.9 % IJ SOLN: 3.0000 mL | INTRAMUSCULAR | Status: DC | PRN

## 2011-05-08 MED ORDER — ONDANSETRON HCL 4 MG PO TABS: 4.0000 mg | ORAL_TABLET | ORAL | Status: DC | PRN

## 2011-05-08 MED ORDER — ZOLPIDEM TARTRATE 5 MG PO TABS: 5.0000 mg | ORAL_TABLET | Freq: Every evening | ORAL | Status: DC | PRN

## 2011-05-08 MED ORDER — SIMETHICONE 80 MG PO CHEW: 80.0000 mg | CHEWABLE_TABLET | ORAL | Status: DC | PRN

## 2011-05-08 MED ORDER — SODIUM CHLORIDE 0.9 % IJ SOLN: 3.0000 mL | Freq: Two times a day (BID) | INTRAMUSCULAR | Status: DC

## 2011-05-08 MED ORDER — LANOLIN HYDROUS EX OINT: TOPICAL_OINTMENT | CUTANEOUS | Status: DC | PRN

## 2011-05-08 MED ORDER — OXYTOCIN 20 UNITS IN LACTATED RINGERS INFUSION - SIMPLE: 125.0000 mL/h | INTRAVENOUS | Status: DC | PRN

## 2011-05-08 NOTE — Discharge Summary (Signed)
Obstetric Discharge Summary Reason for Admission: onset of labor Prenatal Procedures: none Intrapartum Procedures: spontaneous vaginal delivery Postpartum Procedures: none Complications-Operative and Postpartum: none Hemoglobin  Date Value Range Status  05/07/2011 8.8* 12.0-15.0 (g/dL) Final     HCT  Date Value Range Status  05/07/2011 27.7* 36.0-46.0 (%) Final    Discharge Diagnoses: Term Pregnancy-delivered  Discharge Information: Date: 05/08/2011 Activity: pelvic rest Diet: routine Medications: Tylenol #3 Condition: stable Instructions: refer to practice specific booklet Discharge to: home Follow-up Information    Follow up with Sarahanne Novakowski A, MD. Call in 6 weeks.   Contact information:   [address] [place] [address] [phone]          Newborn Data: Live born female  Birth Weight: 7 lb 3.7 oz (3280 g) APGAR: 9, 9  Home with mother.  Katherine Mcguire A 05/08/2011, 7:23 AM

## 2011-05-08 NOTE — Progress Notes (Signed)
  Postpartum day 2 Bile signs normal Fundus firm Lochia moderate Legs negative No complaints Home today on Tylenol #3

## 2011-05-08 NOTE — Anesthesia Postprocedure Evaluation (Signed)
  Anesthesia Post-op Note  Patient: Katherine Mcguire  Procedure(s) Performed: * Lumbar Epidural for L & D *  Patient Location: Labor and Delivery  Anesthesia Type: Epidural  Level of Consciousness: awake, alert  and oriented  Airway and Oxygen Therapy: Patient Spontanous Breathing  Post-op Pain: none  Post-op Assessment: Post-op Vital signs reviewed, Patient's Cardiovascular Status Stable, Respiratory Function Stable, Patent Airway, No signs of Nausea or vomiting, Pain level controlled, No headache, No backache, No residual numbness and No residual motor weakness  Post-op Vital Signs: Reviewed and stable  Complications: No apparent anesthesia complications

## 2011-07-09 LAB — CBC
Platelets: 241
RDW: 16.2 — ABNORMAL HIGH

## 2011-07-09 LAB — CCBB MATERNAL DONOR DRAW

## 2012-12-15 ENCOUNTER — Encounter (HOSPITAL_COMMUNITY): Payer: Self-pay | Admitting: Emergency Medicine

## 2012-12-15 ENCOUNTER — Emergency Department (HOSPITAL_COMMUNITY)
Admission: EM | Admit: 2012-12-15 | Discharge: 2012-12-16 | Disposition: A | Discharge status: Home / Self Care | Payer: Self-pay | Attending: Emergency Medicine | Admitting: Emergency Medicine

## 2012-12-15 DIAGNOSIS — J029 Acute pharyngitis, unspecified: Secondary | ICD-10-CM | POA: Insufficient documentation

## 2012-12-15 DIAGNOSIS — J45909 Unspecified asthma, uncomplicated: Secondary | ICD-10-CM | POA: Insufficient documentation

## 2012-12-15 DIAGNOSIS — R0602 Shortness of breath: Secondary | ICD-10-CM | POA: Insufficient documentation

## 2012-12-15 LAB — POCT I-STAT TROPONIN I: Troponin i, poc: 0 ng/mL (ref 0.00–0.08)

## 2012-12-15 NOTE — ED Notes (Signed)
Patient states that she feels like her throat was sore yesterday and today her chest is tight and she is SOB  - "I feel like it is closing up"

## 2012-12-16 ENCOUNTER — Emergency Department (HOSPITAL_COMMUNITY): Payer: Self-pay

## 2012-12-16 LAB — POCT I-STAT, CHEM 8
Calcium, Ion: 1.26 mmol/L — ABNORMAL HIGH (ref 1.12–1.23)
Glucose, Bld: 89 mg/dL (ref 70–99)
HCT: 42 % (ref 36.0–46.0)
Hemoglobin: 14.3 g/dL (ref 12.0–15.0)

## 2012-12-16 MED ORDER — IPRATROPIUM BROMIDE 0.02 % IN SOLN
0.5000 mg | Freq: Once | RESPIRATORY_TRACT | Status: AC
  Administered 2012-12-16: 0.5 mg via RESPIRATORY_TRACT
  Filled 2012-12-16: qty 2.5

## 2012-12-16 MED ORDER — ALBUTEROL SULFATE (5 MG/ML) 0.5% IN NEBU
5.0000 mg | INHALATION_SOLUTION | Freq: Once | RESPIRATORY_TRACT | Status: AC
  Administered 2012-12-16: 5 mg via RESPIRATORY_TRACT
  Filled 2012-12-16: qty 1

## 2012-12-16 NOTE — ED Provider Notes (Signed)
History     CSN: [id number]  Arrival date & time 12/15/12  2226   First MD Initiated Contact with Patient 12/16/12 0004      Chief Complaint  Patient presents with  . Chest Pain  . Shortness of Breath    HPI  History provided by the patient. Patient is a 32 year old female with history of asthma who presents with complaints of sore throat and throat discomfort as well as shortness of breath and chest discomfort. Symptoms first began yesterday with tightness and slight ache in her throat. She denied having any difficulty swallowing or breathing at that time but today she began to have increase shortness of breath and chest discomfort is in the center and substernal area of her chest. Pain and discomfort does not radiate. Does not seem significantly changed with position or activity. She denies having any significant cough but does report occasional dry coughing. She did not try using any of her albuterol treatments and has not used any other treatments for symptoms. Denies any aggravating or alleviating factors. Symptoms have been persistent all day without any improvements. Denies any recent long travel. Hemoptysis. Syncope. Estrogen use or birth control use. No active cancer. No past history of DVT or PE. No other associated symptoms.    Past Medical History  Diagnosis Date  . Asthma     Past Surgical History  Procedure Laterality Date  . Cesarean section      History reviewed. No pertinent family history.  History  Substance Use Topics  . Smoking status: Never Smoker   . Smokeless tobacco: Not on file  . Alcohol Use: No    OB History   Grav Para Term Preterm Abortions TAB SAB Ect Mult Living   4 4 2 2    0   4      Review of Systems  Constitutional: Negative for fever and chills.  HENT: Positive for sore throat. Negative for trouble swallowing and voice change.   Respiratory: Positive for cough and shortness of breath.   Gastrointestinal: Negative for nausea and  vomiting.  All other systems reviewed and are negative.    Allergies  Review of patient's allergies indicates no known allergies.  Home Medications   Current Outpatient Rx  Name  Route  Sig  Dispense  Refill  . ibuprofen (ADVIL,MOTRIN) 200 MG tablet   Oral   Take 200 mg by mouth every 6 (six) hours as needed for pain.           BP 126/78  Pulse 105  Temp(Src) 97.5 F (36.4 C) (Oral)  Resp 18  Ht 5\' 5"  (1.651 m)  Wt 149 lb (67.586 kg)  BMI 24.79 kg/m2  SpO2 100%  LMP 11/23/2012  Physical Exam  Nursing note and vitals reviewed. Constitutional: She is oriented to person, place, and time. She appears well-developed and well-nourished. No distress.  HENT:  Head: Normocephalic.  Neck: Normal range of motion. Neck supple. No thyromegaly present.  Cardiovascular: Normal rate and regular rhythm.   No murmur heard. Pulmonary/Chest: Effort normal and breath sounds normal. No stridor. No respiratory distress. She has no wheezes. She has no rales.  Abdominal: Soft. There is no tenderness. There is no rebound and no guarding.  Musculoskeletal: Normal range of motion. She exhibits no edema and no tenderness.  No clinical signs concerning for DVT  Neurological: She is alert and oriented to person, place, and time.  Skin: Skin is warm and dry. No rash noted.  Psychiatric: She has  a normal mood and affect. Her behavior is normal.    ED Course  Procedures   Labs Reviewed  POCT I-STAT TROPONIN I   Dg Chest 2 View  12/16/2012  *RADIOLOGY REPORT*  Clinical Data: Shortness of breath.  CHEST - 2 VIEW  Comparison: 07/26/2009.  Findings: The cardiac silhouette, mediastinal and hilar contours are normal and stable.  The lungs are clear.  No pleural effusion. The bony thorax is intact.  IMPRESSION: No acute cardiopulmonary findings.   Original Report Authenticated By: Rudie Meyer, M.D.      1. Shortness of breath       MDM  Patient seen and evaluated. Patient well-appearing in  no acute distress. She has normal respirations and O2 sats. Normal heart rate on exam. No concerning risk factors for DVT or PE. No clinical signs for DVT.  Patient continues to appear well. She has unchanged EKG from 2 years ago. Negative troponin. Symptoms have been present and persistent for the whole day. Doubt any ACS. She has no other significant risk factors suggest ACS.  No signs of pneumonia or significant bronchitis on x-ray. Patient does report improvement after breathing treatment. No significant wheezing on exam.  At this time patient felt stable to return home and followup outpatient with PCP. She was encouraged to continue her albuterol as needed    Date: 12/16/2012  Rate: 104  Rhythm: sinus tachycardia  QRS Axis: normal  Intervals: normal  ST/T Wave abnormalities: nonspecific T wave changes  Conduction Disutrbances:none  Narrative Interpretation: Some flattened T waves  Old EKG Reviewed: unchanged from 04/24/2011       Angus Seller, PA-C 12/16/12 2353

## 2012-12-16 NOTE — ED Notes (Signed)
Respiratory called for breathing treatment.

## 2012-12-17 NOTE — ED Provider Notes (Signed)
Medical screening examination/treatment/procedure(s) were performed by non-physician practitioner and as supervising physician I was immediately available for consultation/collaboration.  Sunnie Nielsen, MD 12/17/12 [phone]

## 2014-01-18 ENCOUNTER — Inpatient Hospital Stay (HOSPITAL_COMMUNITY): Payer: BC Managed Care – PPO

## 2014-01-18 ENCOUNTER — Encounter (HOSPITAL_COMMUNITY): Payer: Self-pay

## 2014-01-18 ENCOUNTER — Inpatient Hospital Stay (HOSPITAL_COMMUNITY)
Admission: AD | Admit: 2014-01-18 | Discharge: 2014-01-19 | Disposition: A | Discharge status: Home / Self Care | Payer: BC Managed Care – PPO | Source: Ambulatory Visit | Attending: Family Medicine | Admitting: Family Medicine

## 2014-01-18 DIAGNOSIS — R109 Unspecified abdominal pain: Secondary | ICD-10-CM | POA: Insufficient documentation

## 2014-01-18 DIAGNOSIS — O034 Incomplete spontaneous abortion without complication: Secondary | ICD-10-CM | POA: Insufficient documentation

## 2014-01-18 LAB — CBC WITH DIFFERENTIAL/PLATELET
Basophils Absolute: 0 10*3/uL (ref 0.0–0.1)
Basophils Relative: 0 % (ref 0–1)
EOS PCT: 2 % (ref 0–5)
Eosinophils Absolute: 0.2 10*3/uL (ref 0.0–0.7)
HCT: 37.8 % (ref 36.0–46.0)
Hemoglobin: 12.8 g/dL (ref 12.0–15.0)
LYMPHS ABS: 2.8 10*3/uL (ref 0.7–4.0)
LYMPHS PCT: 40 % (ref 12–46)
MCH: 30.4 pg (ref 26.0–34.0)
MCHC: 33.9 g/dL (ref 30.0–36.0)
MCV: 89.8 fL (ref 78.0–100.0)
Monocytes Absolute: 0.5 10*3/uL (ref 0.1–1.0)
Monocytes Relative: 8 % (ref 3–12)
NEUTROS ABS: 3.5 10*3/uL (ref 1.7–7.7)
Neutrophils Relative %: 50 % (ref 43–77)
PLATELETS: 236 10*3/uL (ref 150–400)
RBC: 4.21 MIL/uL (ref 3.87–5.11)
RDW: 13.4 % (ref 11.5–15.5)
WBC: 7 10*3/uL (ref 4.0–10.5)

## 2014-01-18 LAB — URINE MICROSCOPIC-ADD ON

## 2014-01-18 LAB — URINALYSIS, ROUTINE W REFLEX MICROSCOPIC
Bilirubin Urine: NEGATIVE
GLUCOSE, UA: NEGATIVE mg/dL
Ketones, ur: NEGATIVE mg/dL
LEUKOCYTES UA: NEGATIVE
Nitrite: NEGATIVE
PH: 6 (ref 5.0–8.0)
PROTEIN: NEGATIVE mg/dL
SPECIFIC GRAVITY, URINE: 1.015 (ref 1.005–1.030)
Urobilinogen, UA: 0.2 mg/dL (ref 0.0–1.0)

## 2014-01-18 LAB — POCT PREGNANCY, URINE: Preg Test, Ur: POSITIVE — AB

## 2014-01-18 LAB — WET PREP, GENITAL
Clue Cells Wet Prep HPF POC: NONE SEEN
Trich, Wet Prep: NONE SEEN
YEAST WET PREP: NONE SEEN

## 2014-01-18 LAB — HCG, QUANTITATIVE, PREGNANCY: HCG, BETA CHAIN, QUANT, S: 1108 m[IU]/mL — AB (ref <5)

## 2014-01-18 LAB — OB RESULTS CONSOLE GC/CHLAMYDIA
CHLAMYDIA, DNA PROBE: NEGATIVE
GC PROBE AMP, GENITAL: NEGATIVE

## 2014-01-18 MED ORDER — IBUPROFEN 600 MG PO TABS: 600.0000 mg | ORAL_TABLET | Freq: Four times a day (QID) | ORAL | Status: DC | PRN

## 2014-01-18 NOTE — MAU Provider Note (Signed)
Chief Complaint: Vaginal Bleeding and Abdominal Cramping   First Provider Initiated Contact with Patient 01/18/14 2324     SUBJECTIVE HPI: Katherine Mcguire is a 33 y.o. [id number] at 4375w0d by LMP who presents with vaginal bleeding, low abdominal cramping and low back pain since this morning. We've started a slight pink this morning and progressed to heavy with clots. States she filled a pad every 2 hours this evening. Denies passage of tissue. Rates pain 6/10 on pain scale. Took extra strength Tylenol with some minimal relief. B pos. No prior testing this pregnancy.  Has new OB visit with Dr. Gaynell FaceMarshall scheduled 01/21/2014.  Past Medical History  Diagnosis Date  . Asthma   . Preterm labor    OB History  Gravida Para Term Preterm AB SAB TAB Ectopic Multiple Living  6 4 3 1 1  0 1   4    # Outcome Date GA Lbr Len/2nd Weight Sex Delivery Anes PTL Lv  6 CUR           5 TRM 05/06/11 5632w1d 08:02 / 02:45 3.28 kg (7 lb 3.7 oz) M VBAC EPI  Y     Comments: WNL  4 TRM 2008     VBAC   Y  3 PRE 2007 50100w0d    LTCS   Y     Comments: fetal distress  2 TRM 2005     SVD   Y  1 TAB 2002             Past Surgical History  Procedure Laterality Date  . Cesarean section     History   Social History  . Marital Status: Single    Spouse Name: N/A    Number of Children: N/A  . Years of Education: N/A   Occupational History  . Not on file.   Social History Main Topics  . Smoking status: Never Smoker   . Smokeless tobacco: Not on file  . Alcohol Use: No  . Drug Use: No  . Sexual Activity: Yes   Other Topics Concern  . Not on file   Social History Narrative  . No narrative on file   No current facility-administered medications on file prior to encounter.   No current outpatient prescriptions on file prior to encounter.   No Known Allergies  ROS: Pertinent positive items in HPI. Negative for fever, chills, passage of tissue, urinary complaints, GI complaints or vaginal  discharge.  OBJECTIVE Blood pressure 125/84, pulse 95, temperature 98.2 F (36.8 C), temperature source Oral, resp. rate 16, height 5\' 5"  (1.651 m), weight 71.578 kg (157 lb 12.8 oz), last menstrual period 10/26/2013. GENERAL: Well-developed, well-nourished female in no acute distress.  HEENT: Normocephalic HEART: normal rate RESP: normal effort ABDOMEN: Soft, non-tender. Uterus nonpalpable. No CVAT. EXTREMITIES: Nontender, no edema NEURO: Alert and oriented SPECULUM EXAM: NEFG, small amount of dark red blood and mucus coming from os, cervix clean BIMANUAL: cervix closed; uterus slightly enlarged. no adnexal tenderness or masses. No cervical motion tenderness. UTA FHTs by doppler.   LAB RESULTS Results for orders placed during the hospital encounter of 01/18/14 (from the past 24 hour(s))  URINALYSIS, ROUTINE W REFLEX MICROSCOPIC     Status: Abnormal   Collection Time    01/18/14  9:44 PM      Result Value Ref Range   Color, Urine YELLOW  YELLOW   APPearance CLEAR  CLEAR   Specific Gravity, Urine 1.015  1.005 - 1.030   pH 6.0  5.0 -  8.0   Glucose, UA NEGATIVE  NEGATIVE mg/dL   Hgb urine dipstick LARGE (*) NEGATIVE   Bilirubin Urine NEGATIVE  NEGATIVE   Ketones, ur NEGATIVE  NEGATIVE mg/dL   Protein, ur NEGATIVE  NEGATIVE mg/dL   Urobilinogen, UA 0.2  0.0 - 1.0 mg/dL   Nitrite NEGATIVE  NEGATIVE   Leukocytes, UA NEGATIVE  NEGATIVE  URINE MICROSCOPIC-ADD ON     Status: None   Collection Time    01/18/14  9:44 PM      Result Value Ref Range   Squamous Epithelial / LPF RARE  RARE   WBC, UA 0-2  <3 WBC/hpf   RBC / HPF 0-2  <3 RBC/hpf   Bacteria, UA RARE  RARE  POCT PREGNANCY, URINE     Status: Abnormal   Collection Time    01/18/14  9:53 PM      Result Value Ref Range   Preg Test, Ur POSITIVE (*) NEGATIVE  CBC WITH DIFFERENTIAL     Status: None   Collection Time    01/18/14 10:10 PM      Result Value Ref Range   WBC 7.0  4.0 - 10.5 K/uL   RBC 4.21  3.87 - 5.11 MIL/uL    Hemoglobin 12.8  12.0 - 15.0 g/dL   HCT 16.1  09.6 - 04.5 %   MCV 89.8  78.0 - 100.0 fL   MCH 30.4  26.0 - 34.0 pg   MCHC 33.9  30.0 - 36.0 g/dL   RDW 40.9  81.1 - 91.4 %   Platelets 236  150 - 400 K/uL   Neutrophils Relative % 50  43 - 77 %   Neutro Abs 3.5  1.7 - 7.7 K/uL   Lymphocytes Relative 40  12 - 46 %   Lymphs Abs 2.8  0.7 - 4.0 K/uL   Monocytes Relative 8  3 - 12 %   Monocytes Absolute 0.5  0.1 - 1.0 K/uL   Eosinophils Relative 2  0 - 5 %   Eosinophils Absolute 0.2  0.0 - 0.7 K/uL   Basophils Relative 0  0 - 1 %   Basophils Absolute 0.0  0.0 - 0.1 K/uL  HCG, QUANTITATIVE, PREGNANCY     Status: Abnormal   Collection Time    01/18/14 10:10 PM      Result Value Ref Range   hCG, Beta Chain, Quant, S 1108 (*) <5 mIU/mL  WET PREP, GENITAL     Status: Abnormal   Collection Time    01/18/14 11:16 PM      Result Value Ref Range   Yeast Wet Prep HPF POC NONE SEEN  NONE SEEN   Trich, Wet Prep NONE SEEN  NONE SEEN   Clue Cells Wet Prep HPF POC NONE SEEN  NONE SEEN   WBC, Wet Prep HPF POC RARE (*) NONE SEEN    IMAGING US Ob Comp Less 14 Wks  01/18/2014   CLINICAL DATA:  Pelvic pain and back pain.  Vaginal bleeding.  EXAM: OBSTETRIC <14 WK [place] AND TRANSVAGINAL OB US  TECHNIQUE: Both transabdominal and transvaginal ultrasound examinations were performed for complete evaluation of the gestation as well as the maternal uterus, adnexal regions, and pelvic cul-de-sac. Transvaginal technique was performed to assess early pregnancy.  COMPARISON:  None.  FINDINGS: Intrauterine gestational sac:  Visualized/irregularly shaped.  Yolk sac:  No  Embryo:  No  Cardiac Activity: N/A  MSD: 3.2 cm   8 w   4  d  Maternal uterus/adnexae: A small amount of subchorionic hemorrhage is noted.  The ovaries are unremarkable in appearance. The right ovary measures 2.7 x 1.9 x 1.7 cm, while the left ovary measures 3.9 x 1.4 x 1.9 cm. No suspicious adnexal masses are seen; there is no evidence for ovarian torsion.   No free fluid is seen within the pelvic cul-de-sac.  IMPRESSION: 1. Irregularly shaped 3.2 cm gestational sac, without evidence of a yolk sac or embryo. Findings are compatible with a blighted ovum, and meet definitive criteria for failed pregnancy. This follows SRU consensus guidelines: Diagnostic Criteria for Nonviable Pregnancy Early in the First Trimester. [employer] Mis J Med [phone]. 2. Small amount of subchorionic hemorrhage noted.   Electronically Signed   By: Roanna Raider M.D.   On: 01/18/2014 23:40   US Ob Transvaginal  01/18/2014   CLINICAL DATA:  Pelvic pain and back pain.  Vaginal bleeding.  EXAM: OBSTETRIC <14 WK [place] AND TRANSVAGINAL OB US  TECHNIQUE: Both transabdominal and transvaginal ultrasound examinations were performed for complete evaluation of the gestation as well as the maternal uterus, adnexal regions, and pelvic cul-de-sac. Transvaginal technique was performed to assess early pregnancy.  COMPARISON:  None.  FINDINGS: Intrauterine gestational sac:  Visualized/irregularly shaped.  Yolk sac:  No  Embryo:  No  Cardiac Activity: N/A  MSD: 3.2 cm   8 w   4  d  Maternal uterus/adnexae: A small amount of subchorionic hemorrhage is noted.  The ovaries are unremarkable in appearance. The right ovary measures 2.7 x 1.9 x 1.7 cm, while the left ovary measures 3.9 x 1.4 x 1.9 cm. No suspicious adnexal masses are seen; there is no evidence for ovarian torsion.  No free fluid is seen within the pelvic cul-de-sac.  IMPRESSION: 1. Irregularly shaped 3.2 cm gestational sac, without evidence of a yolk sac or embryo. Findings are compatible with a blighted ovum, and meet definitive criteria for failed pregnancy. This follows SRU consensus guidelines: Diagnostic Criteria for Nonviable Pregnancy Early in the First Trimester. [employer] Mis J Med [phone]. 2. Small amount of subchorionic hemorrhage noted.   Electronically Signed   By: Roanna Raider M.D.   On: 01/18/2014 23:40    MAU  COURSE  ASSESSMENT 1. Incomplete miscarriage     PLAN Discharge home in stable condition. Support given. Declines chaplain. Discussed expectant management versus Cytotec versus D&C. Recommend expectant management until patient can talk to Dr. Gaynell Face on Thursday. Bleeding precautions.     Follow-up Information   Follow up with Kathreen Cosier, MD On 01/21/2014. (As scheduled. Call office and tell them that it will be a miscarriage follow-up visit instead of a new OB visit)    Specialty:  Obstetrics and Gynecology   Contact information:   [address] [place] [address] [phone]       Follow up with [facility]. (As needed in emergencies)    Contact information:   [address] [id number] [place] [address] [phone]       Medication List         acetaminophen 500 MG tablet  Commonly known as:  TYLENOL  Take 1,000 mg by mouth every 6 (six) hours as needed for headache.     FOLIC ACID PO  Take 1 tablet by mouth daily.     ibuprofen 600 MG tablet  Commonly known as:  ADVIL,MOTRIN  Take 1 tablet (600 mg total) by mouth every 6 (six) hours as  needed for cramping.     OVER THE COUNTER MEDICATION  Take 2 tablets by mouth at bedtime. Sleep aid     prenatal multivitamin Tabs tablet  Take 1 tablet by mouth daily at 12 noon.       [place] Geraldine Sandberg, CNM 01/18/2014  11:55 PM

## 2014-01-18 NOTE — MAU Note (Signed)
Had some pink spotting this morning; at 4pm was heavier and darker red with small clots. Mild abdominal cramping and lower back pain since this morning. Small amount of white discharge that is normal for her.

## 2014-01-18 NOTE — Discharge Instructions (Signed)
Incomplete Miscarriage A miscarriage is the sudden loss of an unborn baby (fetus) before the 20th week of pregnancy. In an incomplete miscarriage, parts of the fetus or placenta (afterbirth) remain in the body.  Having a miscarriage can be an emotional experience. Talk with your health care provider about any questions you may have about miscarrying, the grieving process, and your future pregnancy plans. CAUSES   Problems with the fetal chromosomes that make it impossible for the baby to develop normally. Problems with the baby's genes or chromosomes are most often the result of errors that occur by chance as the embryo divides and grows. The problems are not inherited from the parents.  Infection of the cervix or uterus.  Hormone problems.  Problems with the cervix, such as having an incompetent cervix. This is when the tissue in the cervix is not strong enough to hold the pregnancy.  Problems with the uterus, such as an abnormally shaped uterus, uterine fibroids, or congenital abnormalities.  Certain medical conditions.  Smoking, drinking alcohol, or taking illegal drugs.  Trauma. SYMPTOMS   Vaginal bleeding or spotting, with or without cramps or pain.  Pain or cramping in the abdomen or lower back.  Passing fluid, tissue, or blood clots from the vagina. DIAGNOSIS  Your health care provider will perform a physical exam. You may also have an ultrasound to confirm the miscarriage. Blood or urine tests may also be ordered. TREATMENT   Sometimes, a dilation and curettage (D&C) procedure is performed. During a D&C procedure, the cervix is widened (dilated) and any remaining fetal or placental tissue is gently removed from the uterus.  Antibiotic medicines are prescribed if there is an infection. Other medicines may be given to reduce the size of the uterus (contract) if there is a lot of bleeding.  If you have Rh negative blood and your baby was Rh positive, you will need an Rho(D)  immune globulin shot. This shot will protect any future baby from having Rh blood problems in future pregnancies.  You may be confined to bed rest. This means you should stay in bed and only get up to use the bathroom. HOME CARE INSTRUCTIONS   Rest as directed by your health care provider.  Restrict activity as directed by your health care provider. You may be allowed to continue light activity if curettage was not done but you require further treatment.  Keep track of the number of pads you use each day. Keep track of how soaked (saturated) they are. Record this information.  Do not  use tampons.  Do not douche or have sexual intercourse until approved by your health care provider.  Keep all follow-up appointments for re-evaluation and continuing management.  Only take over-the-counter or prescription medicines for pain, fever, or discomfort as directed by your health care provider.  Take antibiotic medicine as directed by your health care provider. Make sure you finish it even if you start to feel better. SEEK IMMEDIATE MEDICAL CARE IF:   You experience severe cramps in your stomach, back, or abdomen.  You have an unexplained temperature (make sure to record these temperatures).  You pass large clots or tissue (save these for your health care provider to inspect).  Your bleeding increases.  You become light-headed, weak, or have fainting episodes. MAKE SURE YOU:   Understand these instructions.  Will watch your condition.  Will get help right away if you are not doing well or get worse. Document Released: 09/10/2005 Document Revised: 07/01/2013 Document Reviewed: 04/09/2013  ExitCare Patient Information [address].  FACTS YOU SHOULD KNOW  WHAT IS AN EARLY PREGNANCY FAILURE? Once the egg is fertilized with the sperm and begins to develop, it attaches to the lining of the uterus. This early pregnancy tissue may not develop into an embryo (the beginning stage of a  baby). Sometimes an embryo does develop but does not continue to grow. These problems can be seen on ultrasound.   MANAGEMNT OF EARLY PREGNANCY FAILURE: About 4 out of 100 (0.25%) women will have a pregnancy loss in her lifetime.  One in five pregnancies is found to be an early pregnancy failure.  There are 3 ways to care for an early pregnancy failure:   (1) Surgery, (2) Medicine, (3) Waiting for you to pass the pregnancy on your own. The decision as to how to proceed after being diagnosed with and early pregnancy failure is an individual one.  The decision can be made only after appropriate counseling.  You need to weigh the pros and cons of the 3 choices. Then you can make the choice that works for you. SURGERY (D&E)   Procedure over in 1 day   Requires being put to sleep   Bleeding may be light   Possible problems during surgery, including injury to womb(uterus)   Care provider has more control Medicine (CYTOTEC)   The complete procedure may take days to weeks   No Surgery   Bleeding may be heavy at times   There may be drug side effects   Patient has more control Waiting   You may choose to wait, in which case your own body may complete the passing of the abnormal early pregnancy on its own in about 2-4 weeks   Your bleeding may be heavy at times   There is a small possibility that you may need surgery if the bleeding is too much or not all of the pregnancy has passed. CYTOTEC MANAGEMENT Prostaglandins (cytotec) are the most widely used drug for this purpose. They cause the uterus to cramp and contract. You will place the medicine yourself inside your vagina in the privacy of your home. Empting of the uterus should occur within 3 days but the process may continue for several weeks. The bleeding may seem heavy at times. POSSIBLE SIDE EFFECTS FROM CYTOTEC   Nausea   Vomiting   Diarrhea Fever   Chills  Hot Flashes Side effects  from the process of the early pregnancy failure include:     Cramping  Bleeding   Headaches  Dizziness RISKS: This is a low risk procedure. Less than 1 in 100 women has a complication. An incomplete passage of the early pregnancy may occur. Also, Hemorrhage (heavy bleeding) could happen.  Rarely the pregnancy will not be passed completely. Excessively heavy bleeding may occur.  Your doctor may need to perform surgery to empty the uterus (D&E). Afterwards: Everybody will feel differently after the early pregnancy completion. You may have soreness or cramps for a day or two. You may have soreness or cramps for day or two.  You may have light bleeding for up to 2 weeks. You may be as active as you feel like being. If you have any of the following problems you may call Maternity Admissions Unit at [phone].   If you have pain that does not get better  with pain medication   Bleeding that soaks through 2 thick full-sized sanitary pads in an hour   Cramps that last longer than 2 days  Foul smelling discharge   Fever above 100.4 degrees F Even if you do not have any of these symptoms, you should have a follow-up exam to make sure you are healing properly. This appointment will be made for you before you leave the hospital. Your next normal period will start again in 4-6 week after the loss. You can get pregnant soon after the loss, so use birth control right away. Finally: Make sure all your questions are answered before during and after any procedure. Follow up with medical care and family planning methods.

## 2014-01-19 LAB — GC/CHLAMYDIA PROBE AMP
CT PROBE, AMP APTIMA: NEGATIVE
GC PROBE AMP APTIMA: NEGATIVE

## 2014-01-20 NOTE — MAU Provider Note (Signed)
Attestation of Attending Supervision of Advanced Practitioner (PA/CNM/NP): Evaluation and management procedures were performed by the Advanced Practitioner under my supervision and collaboration.  I have reviewed the Advanced Practitioner's note and chart, and I agree with the management and plan.  Tanya S Pratt, MD [facility] Attending 01/20/2014 8:45 AM   

## 2014-01-21 ENCOUNTER — Encounter (HOSPITAL_COMMUNITY): Payer: BC Managed Care – PPO | Admitting: Anesthesiology

## 2014-01-21 ENCOUNTER — Encounter (HOSPITAL_COMMUNITY): Payer: Self-pay | Admitting: *Deleted

## 2014-01-21 ENCOUNTER — Inpatient Hospital Stay (HOSPITAL_COMMUNITY): Payer: BC Managed Care – PPO | Admitting: Anesthesiology

## 2014-01-21 ENCOUNTER — Encounter (HOSPITAL_COMMUNITY)
Admission: AD | Disposition: A | Discharge status: Home / Self Care | Payer: Self-pay | Source: Ambulatory Visit | Attending: Obstetrics & Gynecology

## 2014-01-21 ENCOUNTER — Inpatient Hospital Stay (HOSPITAL_COMMUNITY)
Admission: AD | Admit: 2014-01-21 | Discharge: 2014-01-21 | Disposition: A | Discharge status: Home / Self Care | Payer: BC Managed Care – PPO | Source: Ambulatory Visit | Attending: Obstetrics & Gynecology | Admitting: Obstetrics & Gynecology

## 2014-01-21 DIAGNOSIS — O034 Incomplete spontaneous abortion without complication: Secondary | ICD-10-CM | POA: Insufficient documentation

## 2014-01-21 DIAGNOSIS — O021 Missed abortion: Secondary | ICD-10-CM

## 2014-01-21 DIAGNOSIS — O0289 Other abnormal products of conception: Secondary | ICD-10-CM | POA: Insufficient documentation

## 2014-01-21 HISTORY — PX: DILATION AND EVACUATION: SHX1459

## 2014-01-21 LAB — CBC
HCT: 36.3 % (ref 36.0–46.0)
Hemoglobin: 12.2 g/dL (ref 12.0–15.0)
MCH: 30 pg (ref 26.0–34.0)
MCHC: 33.6 g/dL (ref 30.0–36.0)
MCV: 89.4 fL (ref 78.0–100.0)
PLATELETS: 221 10*3/uL (ref 150–400)
RBC: 4.06 MIL/uL (ref 3.87–5.11)
RDW: 13.2 % (ref 11.5–15.5)
WBC: 7.9 10*3/uL (ref 4.0–10.5)

## 2014-01-21 LAB — TYPE AND SCREEN
ABO/RH(D): B POS
Antibody Screen: NEGATIVE

## 2014-01-21 LAB — ABO/RH: ABO/RH(D): B POS

## 2014-01-21 SURGERY — DILATION AND EVACUATION, UTERUS
Anesthesia: General | Site: Uterus

## 2014-01-21 MED ORDER — FAMOTIDINE IN NACL 20-0.9 MG/50ML-% IV SOLN
20.0000 mg | Freq: Once | INTRAVENOUS | Status: AC
  Administered 2014-01-21: 20 mg via INTRAVENOUS
  Filled 2014-01-21: qty 50

## 2014-01-21 MED ORDER — KETOROLAC TROMETHAMINE 30 MG/ML IJ SOLN
INTRAMUSCULAR | Status: DC | PRN
  Administered 2014-01-21: 30 mg via INTRAVENOUS

## 2014-01-21 MED ORDER — FENTANYL CITRATE 0.05 MG/ML IJ SOLN
INTRAMUSCULAR | Status: DC | PRN
  Administered 2014-01-21 (×2): 50 ug via INTRAVENOUS

## 2014-01-21 MED ORDER — HYDROMORPHONE HCL PF 1 MG/ML IJ SOLN
1.0000 mg | Freq: Once | INTRAMUSCULAR | Status: AC
  Administered 2014-01-21: 1 mg via INTRAMUSCULAR
  Filled 2014-01-21: qty 1

## 2014-01-21 MED ORDER — DEXAMETHASONE SODIUM PHOSPHATE 10 MG/ML IJ SOLN
INTRAMUSCULAR | Status: AC
  Filled 2014-01-21: qty 1

## 2014-01-21 MED ORDER — LACTATED RINGERS IV BOLUS (SEPSIS): 1000.0000 mL | Freq: Once | INTRAVENOUS | Status: DC

## 2014-01-21 MED ORDER — ONDANSETRON HCL 4 MG/2ML IJ SOLN
INTRAMUSCULAR | Status: DC | PRN
  Administered 2014-01-21: 4 mg via INTRAVENOUS

## 2014-01-21 MED ORDER — LIDOCAINE HCL (CARDIAC) 20 MG/ML IV SOLN
INTRAVENOUS | Status: DC | PRN
  Administered 2014-01-21: 80 mg via INTRAVENOUS

## 2014-01-21 MED ORDER — MISOPROSTOL 200 MCG PO TABS
800.0000 ug | ORAL_TABLET | Freq: Once | ORAL | Status: AC
  Administered 2014-01-21: 800 ug via VAGINAL
  Filled 2014-01-21: qty 4

## 2014-01-21 MED ORDER — LIDOCAINE HCL 1 % IJ SOLN
INTRAMUSCULAR | Status: AC
  Filled 2014-01-21: qty 20

## 2014-01-21 MED ORDER — LIDOCAINE HCL 1 % IJ SOLN
INTRAMUSCULAR | Status: DC | PRN
Start: 2014-01-21 — End: 2014-01-21
  Administered 2014-01-21: 20 mL

## 2014-01-21 MED ORDER — KETOROLAC TROMETHAMINE 30 MG/ML IJ SOLN
INTRAMUSCULAR | Status: AC
  Filled 2014-01-21: qty 1

## 2014-01-21 MED ORDER — PROPOFOL 10 MG/ML IV EMUL
INTRAVENOUS | Status: AC
  Filled 2014-01-21: qty 20

## 2014-01-21 MED ORDER — CITRIC ACID-SODIUM CITRATE 334-500 MG/5ML PO SOLN
30.0000 mL | Freq: Once | ORAL | Status: AC
  Administered 2014-01-21: 30 mL via ORAL
  Filled 2014-01-21: qty 15

## 2014-01-21 MED ORDER — PROPOFOL 10 MG/ML IV BOLUS
INTRAVENOUS | Status: DC | PRN
  Administered 2014-01-21: 150 mg via INTRAVENOUS

## 2014-01-21 MED ORDER — DEXAMETHASONE SODIUM PHOSPHATE 10 MG/ML IJ SOLN
INTRAMUSCULAR | Status: DC | PRN
  Administered 2014-01-21: 10 mg via INTRAVENOUS

## 2014-01-21 MED ORDER — LACTATED RINGERS IV SOLN
INTRAVENOUS | Status: DC
  Administered 2014-01-21 (×2): via INTRAVENOUS

## 2014-01-21 MED ORDER — MIDAZOLAM HCL 2 MG/2ML IJ SOLN
INTRAMUSCULAR | Status: DC | PRN
  Administered 2014-01-21: 2 mg via INTRAVENOUS

## 2014-01-21 MED ORDER — MIDAZOLAM HCL 2 MG/2ML IJ SOLN
INTRAMUSCULAR | Status: AC
  Filled 2014-01-21: qty 2

## 2014-01-21 MED ORDER — ONDANSETRON HCL 4 MG/2ML IJ SOLN
INTRAMUSCULAR | Status: AC
  Filled 2014-01-21: qty 2

## 2014-01-21 MED ORDER — PHENYLEPHRINE 40 MCG/ML (10ML) SYRINGE FOR IV PUSH (FOR BLOOD PRESSURE SUPPORT)
PREFILLED_SYRINGE | INTRAVENOUS | Status: AC
  Filled 2014-01-21: qty 10

## 2014-01-21 MED ORDER — LIDOCAINE HCL (CARDIAC) 20 MG/ML IV SOLN
INTRAVENOUS | Status: AC
  Filled 2014-01-21: qty 5

## 2014-01-21 MED ORDER — LACTATED RINGERS IV BOLUS (SEPSIS)
1000.0000 mL | Freq: Once | INTRAVENOUS | Status: AC
  Administered 2014-01-21: 1000 mL via INTRAVENOUS

## 2014-01-21 MED ORDER — DOXYCYCLINE HYCLATE 100 MG IV SOLR
100.0000 mg | Freq: Once | INTRAVENOUS | Status: AC
  Administered 2014-01-21: 100 mg via INTRAVENOUS
  Filled 2014-01-21: qty 100

## 2014-01-21 MED ORDER — FENTANYL CITRATE 0.05 MG/ML IJ SOLN
INTRAMUSCULAR | Status: AC
  Filled 2014-01-21: qty 5

## 2014-01-21 MED ORDER — PHENYLEPHRINE HCL 10 MG/ML IJ SOLN
INTRAMUSCULAR | Status: DC | PRN
  Administered 2014-01-21: 80 ug via INTRAVENOUS
  Administered 2014-01-21: 40 ug via INTRAVENOUS
  Administered 2014-01-21 (×2): 80 ug via INTRAVENOUS

## 2014-01-21 SURGICAL SUPPLY — 19 items
CATH ROBINSON RED A/P 16FR (CATHETERS) ×3 IMPLANT
CLOTH BEACON ORANGE TIMEOUT ST (SAFETY) ×3 IMPLANT
DECANTER SPIKE VIAL GLASS SM (MISCELLANEOUS) ×3 IMPLANT
GLOVE BIO SURGEON STRL SZ7 (GLOVE) ×3 IMPLANT
GLOVE BIOGEL PI IND STRL 7.0 (GLOVE) ×2 IMPLANT
GLOVE BIOGEL PI INDICATOR 7.0 (GLOVE) ×4
GLOVE NEODERM STER SZ 7 (GLOVE) ×6 IMPLANT
GLOVE SURG SS PI 7.0 STRL IVOR (GLOVE) ×12 IMPLANT
GOWN STRL REUS W/TWL LRG LVL3 (GOWN DISPOSABLE) ×9 IMPLANT
KIT BERKELEY 1ST TRIMESTER 3/8 (MISCELLANEOUS) ×3 IMPLANT
NEEDLE SPNL 22GX3.5 QUINCKE BK (NEEDLE) ×3 IMPLANT
NS IRRIG 1000ML POUR BTL (IV SOLUTION) ×3 IMPLANT
PACK VAGINAL MINOR WOMEN LF (CUSTOM PROCEDURE TRAY) ×3 IMPLANT
PAD OB MATERNITY 4.3X12.25 (PERSONAL CARE ITEMS) ×3 IMPLANT
PAD PREP 24X48 CUFFED NSTRL (MISCELLANEOUS) ×3 IMPLANT
SET BERKELEY SUCTION TUBING (SUCTIONS) ×3 IMPLANT
SYR CONTROL 10ML LL (SYRINGE) ×3 IMPLANT
TOWEL OR 17X24 6PK STRL BLUE (TOWEL DISPOSABLE) ×6 IMPLANT
VACURETTE 10 RIGID CVD (CANNULA) ×3 IMPLANT

## 2014-01-21 NOTE — Transfer of Care (Signed)
Immediate Anesthesia Transfer of Care Note  Patient: Katherine Mcguire  Procedure(s) Performed: Procedure(s): DILATATION AND EVACUATION (N/A)  Patient Location: PACU  Anesthesia Type:General  Level of Consciousness: awake, alert  and oriented  Airway & Oxygen Therapy: Patient Spontanous Breathing and Patient connected to nasal cannula oxygen  Post-op Assessment: Report given to PACU RN, Post -op Vital signs reviewed and stable and Patient moving all extremities  Post vital signs: Reviewed and stable  Complications: No apparent anesthesia complications

## 2014-01-21 NOTE — Discharge Instructions (Signed)

## 2014-01-21 NOTE — Progress Notes (Signed)
To OR

## 2014-01-21 NOTE — MAU Note (Addendum)
States was seen in MAU 3 days ago for miscarriage. Today, having a lot of pain. Continues to have bleeding and clots.

## 2014-01-21 NOTE — H&P (Signed)
Chief Complaint: Abdominal Pain   SUBJECTIVE HPI: Katherine Mcguire is a 33 y.o. G6P3114 @[redacted]w[redacted]d menstrual age with severe abdominal cramping and vaginal bleeding that increased this am. Bleeding has been heavy with clots, saturating 3 pads over last 3 hrs, no tissue noted.Tried ibuprofen without relief today and had been taking it q4h since discharge from MAU. Denies orthostatic symptoms. She was diagnosed with blighted ovum and incomplete SAB when seen in MAU 3 days ago. She presented then with moderately heavy bleeding and cramping. Quantitative beta hCG was 1108. Hemoglobin was 12.8. GC/Chlamydia and wet prep were negative.  Past Medical History  Diagnosis Date  . Asthma   . Preterm labor    OB History  Gravida Para Term Preterm AB SAB TAB Ectopic Multiple Living  6 4 3 1 1 0 1   4    # Outcome Date GA Lbr Len/2nd Weight Sex Delivery Anes PTL Lv  6 CUR           5 TRM 05/06/11 [redacted]w[redacted]d 08:02 / 02:45 3.28 kg (7 lb 3.7 oz) M VBAC EPI  Y     Comments: WNL  4 TRM 2008     VBAC   Y  3 PRE 2007 [redacted]w[redacted]d    LTCS   Y     Comments: fetal distress  2 TRM 2005     SVD   Y  1 TAB 2002             Past Surgical History  Procedure Laterality Date  . Cesarean section     History   Social History  . Marital Status: Single    Spouse Name: N/A    Number of Children: N/A  . Years of Education: N/A   Occupational History  . Not on file.   Social History Main Topics  . Smoking status: Never Smoker   . Smokeless tobacco: Not on file  . Alcohol Use: No  . Drug Use: No  . Sexual Activity: Yes   Other Topics Concern  . Not on file   Social History Narrative  . No narrative on file   No current facility-administered medications on file prior to encounter.   Current Outpatient Prescriptions on File Prior to Encounter  Medication Sig Dispense Refill  . acetaminophen (TYLENOL) 500 MG tablet Take 1,000 mg by mouth every 6 (six) hours as needed for headache.      . FOLIC ACID PO Take 1 tablet  by mouth daily.      . ibuprofen (ADVIL,MOTRIN) 600 MG tablet Take 1 tablet (600 mg total) by mouth every 6 (six) hours as needed for cramping.  30 tablet  1  . OVER THE COUNTER MEDICATION Take 2 tablets by mouth at bedtime. Sleep aid      . Prenatal Vit-Fe Fumarate-FA (PRENATAL MULTIVITAMIN) TABS tablet Take 1 tablet by mouth daily at 12 noon.       No Known Allergies  ROS: Pertinent items in HPI  OBJECTIVE Blood pressure 137/98, pulse 91, temperature 98.2 F (36.8 C), temperature source Oral, resp. rate 20, height 5' 5" (1.651 m), weight 71.668 kg (158 lb), last menstrual period 10/26/2013. GENERAL: Well-developed, well-nourished female crying in apparent pain HEENT: Normocephalic HEART: normal rate RESP: normal effort ABDOMEN: Soft, mild-mod suprapubic tenderness  EXTREMITIES: Nontender, no edema NEURO: Alert and oriented SPECULUM EXAM: NEFG, physiologic discharge, heavy bleeding with some abatement after removed several clots from cx via sponge forceps BIMANUAL: cervix 1cm; uterus slightly enlarged , no adnexal tenderness   or masses  LAB RESULTS Results for orders placed during the hospital encounter of 01/21/14 (from the past 24 hour(s))  CBC     Status: None   Collection Time    01/21/14  9:05 AM      Result Value Ref Range   WBC 7.9  4.0 - 10.5 K/uL   RBC 4.06  3.87 - 5.11 MIL/uL   Hemoglobin 12.2  12.0 - 15.0 g/dL   HCT 36.3  36.0 - 46.0 %   MCV 89.4  78.0 - 100.0 fL   MCH 30.0  26.0 - 34.0 pg   MCHC 33.6  30.0 - 36.0 g/dL   RDW 13.2  11.5 - 15.5 %   Platelets 221  150 - 400 K/uL    IMAGING [place] Ob Comp Less 14 Wks  01/18/2014   CLINICAL DATA:  Pelvic pain and back pain.  Vaginal bleeding.  EXAM: OBSTETRIC <14 WK US AND TRANSVAGINAL OB US  TECHNIQUE: Both transabdominal and transvaginal ultrasound examinations were performed for complete evaluation of the gestation as well as the maternal uterus, adnexal regions, and pelvic cul-de-sac. Transvaginal technique was  performed to assess early pregnancy.  COMPARISON:  None.  FINDINGS: Intrauterine gestational sac:  Visualized/irregularly shaped.  Yolk sac:  No  Embryo:  No  Cardiac Activity: N/A  MSD: 3.2 cm   8 w   4  d  Maternal uterus/adnexae: A small amount of subchorionic hemorrhage is noted.  The ovaries are unremarkable in appearance. The right ovary measures 2.7 x 1.9 x 1.7 cm, while the left ovary measures 3.9 x 1.4 x 1.9 cm. No suspicious adnexal masses are seen; there is no evidence for ovarian torsion.  No free fluid is seen within the pelvic cul-de-sac.  IMPRESSION: 1. Irregularly shaped 3.2 cm gestational sac, without evidence of a yolk sac or embryo. Findings are compatible with a blighted ovum, and meet definitive criteria for failed pregnancy. This follows SRU consensus guidelines: Diagnostic Criteria for Nonviable Pregnancy Early in the First Trimester. N Engl J Med 2013;369:1443-51. 2. Small amount of subchorionic hemorrhage noted.   Electronically Signed   By: Jeffery  Chang M.D.   On: 01/18/2014 23:40   Us Ob Transvaginal  01/18/2014   CLINICAL DATA:  Pelvic pain and back pain.  Vaginal bleeding.  EXAM: OBSTETRIC <14 WK US AND TRANSVAGINAL OB US  TECHNIQUE: Both transabdominal and transvaginal ultrasound examinations were performed for complete evaluation of the gestation as well as the maternal uterus, adnexal regions, and pelvic cul-de-sac. Transvaginal technique was performed to assess early pregnancy.  COMPARISON:  None.  FINDINGS: Intrauterine gestational sac:  Visualized/irregularly shaped.  Yolk sac:  No  Embryo:  No  Cardiac Activity: N/A  MSD: 3.2 cm   8 w   4  d  Maternal uterus/adnexae: A small amount of subchorionic hemorrhage is noted.  The ovaries are unremarkable in appearance. The right ovary measures 2.7 x 1.9 x 1.7 cm, while the left ovary measures 3.9 x 1.4 x 1.9 cm. No suspicious adnexal masses are seen; there is no evidence for ovarian torsion.  No free fluid is seen within the pelvic  cul-de-sac.  IMPRESSION: 1. Irregularly shaped 3.2 cm gestational sac, without evidence of a yolk sac or embryo. Findings are compatible with a blighted ovum, and meet definitive criteria for failed pregnancy. This follows SRU consensus guidelines: Diagnostic Criteria for Nonviable Pregnancy Early in the First Trimester. N Engl J Med 2013;369:1443-51. 2. Small amount of subchorionic hemorrhage noted.     Electronically Signed   By: Jeffery  Chang M.D.   On: 01/18/2014 23:40    MAU COURSE Kept NPO; IV LR  0945: Dilaudid 1 mg IVP with little relief  10:15: Dilaudid 1 mg IVP 10:30: Cytotec 800mg pr Still moderate bleeding and pain> C/W Dr. Jamira Barfuss who will come see pt. 10:55  ASSESSMENT Incomplete SAB  PLAN     Medication List    ASK your doctor about these medications       acetaminophen 500 MG tablet  Commonly known as:  TYLENOL  Take 1,000 mg by mouth every 6 (six) hours as needed for headache.     FOLIC ACID PO  Take 1 tablet by mouth daily.     ibuprofen 600 MG tablet  Commonly known as:  ADVIL,MOTRIN  Take 1 tablet (600 mg total) by mouth every 6 (six) hours as needed for cramping.     OVER THE COUNTER MEDICATION  Take 2 tablets by mouth at bedtime. Sleep aid     prenatal multivitamin Tabs tablet  Take 1 tablet by mouth daily at 12 noon.         Deirdre C Poe, CNM 01/21/2014  8:45 AM   Pt seen and examined.  Pt has soaked several chucks in the MAU and is actively bleeding.  Blood cots or POC in LUS. Patient was counseled regarding need for  D&E.  Risks of surgery including bleeding, infection, injury to surrounding organs, need for additional procedures, possibility of intrauterine scarring which may impair future fertility, risk of retained products which may require further management and other postoperative/anesthesia complications were explained to patient.  Likelihood of success of complete evacuation of the uterus was discussed with the patient.  Written informed  consent was obtained. Anesthesia and OR aware.  Preoperative prophylactic Doxycycline 100mg IV  has been ordered and is on call to the OR.  To OR when ready.   

## 2014-01-21 NOTE — Progress Notes (Signed)
Dr. Penne LashLeggett in to assess patient and discussed need to do  D&E due to persistent bleeding.

## 2014-01-21 NOTE — Op Note (Signed)
Katherine Mcguire PROCEDURE DATE: 01/21/2014  PREOPERATIVE DIAGNOSIS: 32 to female with blighted ovum and bleeding.  POSTOPERATIVE DIAGNOSIS: The same.  PROCEDURE:     Dilation and Evacuation.  SURGEON:  Dr. Elsie LincolnKelly Rogelio Winbush  INDICATIONS: 33 y.o. [id number] with a blighted ovum needing surgical completion.  Risks of surgery were discussed with the patient including but not limited to: bleeding which may require transfusion; infection which may require antibiotics; injury to uterus or surrounding organs;need for additional procedures including laparotomy or laparoscopy; possibility of intrauterine scarring which may impair future fertility; and other postoperative/anesthesia complications. Written informed consent was obtained.    FINDINGS:  A 6 size anteverted uterus, moderate amounts of products of conception, specimen sent to pathology.  ANESTHESIA:    General  ESTIMATED BLOOD LOSS:  50cc  SPECIMENS:  Products of conception sent to pathology  COMPLICATIONS:  None immediate.  PROCEDURE DETAILS:  The patient received intravenous antibiotics while in the preoperative area.  She was then taken to the operating room where monitored intravenous sedation was administered and was found to be adequate.  After an adequate timeout was performed, she was placed in the dorsal lithotomy position and examined; then prepped and draped in the sterile manner.   Her bladder was catheterized for an unmeasured amount of clear, yellow urine. A vaginal speculum was then placed in the patient's vagina and a single tooth tenaculum was applied to the anterior lip of the cervix. The cervix was already dilated to 1.5 cm and POC was extruding from the os.  A ring forceps was used to remove the products form the os.  Next a 9 mm suction curette that was gently advanced to the uterine fundus.  The suction device was then activated and curette slowly rotated to clear the uterus of products of conception.  A sharp curettage was then  performed to confirm complete emptying of the uterus. There was minimal bleeding noted and the tenaculum removed with good hemostasis noted.   All instruments were removed from the patient's vagina. The patient tolerated the procedure well and was taken to the recovery area awake, and in stable condition.  The patient will be discharged to home as per PACU criteria.  Routine postoperative instructions given.  She was prescribed Percocet and Ibuprofen.  She will follow up in the clinic in 2-3 weeks for postoperative evaluation.

## 2014-01-21 NOTE — Anesthesia Preprocedure Evaluation (Addendum)
Anesthesia Evaluation    Reviewed: Allergy & Precautions, H&P , NPO status , Patient's Chart, lab work & pertinent test results  Airway Mallampati: I TM Distance: >3 FB Neck ROM: full    Dental no notable dental hx. (+) Teeth Intact   Pulmonary          Cardiovascular negative cardio ROS      Neuro/Psych negative neurological ROS  negative psych ROS   GI/Hepatic negative GI ROS, Neg liver ROS,   Endo/Other  negative endocrine ROS  Renal/GU negative Renal ROS     Musculoskeletal   Abdominal Normal abdominal exam  (+)   Peds  Hematology negative hematology ROS (+)   Anesthesia Other Findings   Reproductive/Obstetrics negative OB ROS                           Anesthesia Physical Anesthesia Plan  ASA: II  Anesthesia Plan: General   Post-op Pain Management:    Induction: Intravenous  Airway Management Planned: LMA  Additional Equipment:   Intra-op Plan:   Post-operative Plan:   Informed Consent: I have reviewed the patients History and Physical, chart, labs and discussed the procedure including the risks, benefits and alternatives for the proposed anesthesia with the patient or authorized representative who has indicated his/her understanding and acceptance.     Plan Discussed with: CRNA and Surgeon  Anesthesia Plan Comments:        Anesthesia Quick Evaluation

## 2014-01-21 NOTE — MAU Provider Note (Signed)
Chief Complaint: Abdominal Pain   SUBJECTIVE HPI: Katherine Mcguire is a 33 y.o. Z6X0960 @[redacted]w[redacted]d  menstrual age with severe abdominal cramping and vaginal bleeding that increased this am. Bleeding has been heavy with clots, saturating 3 pads over last 3 hrs, no tissue noted.Tried ibuprofen without relief today and had been taking it q4h since discharge from MAU. Denies orthostatic symptoms. She was diagnosed with blighted ovum and incomplete SAB when seen in MAU 3 days ago. She presented then with moderately heavy bleeding and cramping. Quantitative beta hCG was 1108. Hemoglobin was 12.8. GC/Chlamydia and wet prep were negative.  Past Medical History  Diagnosis Date  . Asthma   . Preterm labor    OB History  Gravida Para Term Preterm AB SAB TAB Ectopic Multiple Living  6 4 3 1 1  0 1   4    # Outcome Date GA Lbr Len/2nd Weight Sex Delivery Anes PTL Lv  6 CUR           5 TRM 05/06/11 [redacted]w[redacted]d 08:02 / 02:45 3.28 kg (7 lb 3.7 oz) M VBAC EPI  Y     Comments: WNL  4 TRM 2008     VBAC   Y  3 PRE 2007 [redacted]w[redacted]d    LTCS   Y     Comments: fetal distress  2 TRM 2005     SVD   Y  1 TAB 2002             Past Surgical History  Procedure Laterality Date  . Cesarean section     History   Social History  . Marital Status: Single    Spouse Name: N/A    Number of Children: N/A  . Years of Education: N/A   Occupational History  . Not on file.   Social History Main Topics  . Smoking status: Never Smoker   . Smokeless tobacco: Not on file  . Alcohol Use: No  . Drug Use: No  . Sexual Activity: Yes   Other Topics Concern  . Not on file   Social History Narrative  . No narrative on file   No current facility-administered medications on file prior to encounter.   Current Outpatient Prescriptions on File Prior to Encounter  Medication Sig Dispense Refill  . acetaminophen (TYLENOL) 500 MG tablet Take 1,000 mg by mouth every 6 (six) hours as needed for headache.      Marland Kitchen FOLIC ACID PO Take 1 tablet  by mouth daily.      Marland Kitchen ibuprofen (ADVIL,MOTRIN) 600 MG tablet Take 1 tablet (600 mg total) by mouth every 6 (six) hours as needed for cramping.  30 tablet  1  . OVER THE COUNTER MEDICATION Take 2 tablets by mouth at bedtime. Sleep aid      . Prenatal Vit-Fe Fumarate-FA (PRENATAL MULTIVITAMIN) TABS tablet Take 1 tablet by mouth daily at 12 noon.       No Known Allergies  ROS: Pertinent items in HPI  OBJECTIVE Blood pressure 137/98, pulse 91, temperature 98.2 F (36.8 C), temperature source Oral, resp. rate 20, height 5\' 5"  (1.651 m), weight 71.668 kg (158 lb), last menstrual period 10/26/2013. GENERAL: Well-developed, well-nourished female crying in apparent pain HEENT: Normocephalic HEART: normal rate RESP: normal effort ABDOMEN: Soft, mild-mod suprapubic tenderness  EXTREMITIES: Nontender, no edema NEURO: Alert and oriented SPECULUM EXAM: NEFG, physiologic discharge, heavy bleeding with some abatement after removed several clots from cx via sponge forceps BIMANUAL: cervix 1cm; uterus slightly enlarged , no adnexal tenderness  or masses  LAB RESULTS Results for orders placed during the hospital encounter of 01/21/14 (from the past 24 hour(s))  CBC     Status: None   Collection Time    01/21/14  9:05 AM      Result Value Ref Range   WBC 7.9  4.0 - 10.5 K/uL   RBC 4.06  3.87 - 5.11 MIL/uL   Hemoglobin 12.2  12.0 - 15.0 g/dL   HCT 16.136.3  09.636.0 - 04.546.0 %   MCV 89.4  78.0 - 100.0 fL   MCH 30.0  26.0 - 34.0 pg   MCHC 33.6  30.0 - 36.0 g/dL   RDW 40.913.2  81.111.5 - 91.415.5 %   Platelets 221  150 - 400 K/uL    IMAGING [place] Ob Comp Less 14 Wks  01/18/2014   CLINICAL DATA:  Pelvic pain and back pain.  Vaginal bleeding.  EXAM: OBSTETRIC <14 WK US AND TRANSVAGINAL OB US  TECHNIQUE: Both transabdominal and transvaginal ultrasound examinations were performed for complete evaluation of the gestation as well as the maternal uterus, adnexal regions, and pelvic cul-de-sac. Transvaginal technique was  performed to assess early pregnancy.  COMPARISON:  None.  FINDINGS: Intrauterine gestational sac:  Visualized/irregularly shaped.  Yolk sac:  No  Embryo:  No  Cardiac Activity: N/A  MSD: 3.2 cm   8 w   4  d  Maternal uterus/adnexae: A small amount of subchorionic hemorrhage is noted.  The ovaries are unremarkable in appearance. The right ovary measures 2.7 x 1.9 x 1.7 cm, while the left ovary measures 3.9 x 1.4 x 1.9 cm. No suspicious adnexal masses are seen; there is no evidence for ovarian torsion.  No free fluid is seen within the pelvic cul-de-sac.  IMPRESSION: 1. Irregularly shaped 3.2 cm gestational sac, without evidence of a yolk sac or embryo. Findings are compatible with a blighted ovum, and meet definitive criteria for failed pregnancy. This follows SRU consensus guidelines: Diagnostic Criteria for Nonviable Pregnancy Early in the First Trimester. Macy Mis Engl J Med [phone];369:1443-51. 2. Small amount of subchorionic hemorrhage noted.   Electronically Signed   By: Roanna RaiderJeffery  Chang M.D.   On: 01/18/2014 23:40   [place] Ob Transvaginal  01/18/2014   CLINICAL DATA:  Pelvic pain and back pain.  Vaginal bleeding.  EXAM: OBSTETRIC <14 WK US AND TRANSVAGINAL OB US  TECHNIQUE: Both transabdominal and transvaginal ultrasound examinations were performed for complete evaluation of the gestation as well as the maternal uterus, adnexal regions, and pelvic cul-de-sac. Transvaginal technique was performed to assess early pregnancy.  COMPARISON:  None.  FINDINGS: Intrauterine gestational sac:  Visualized/irregularly shaped.  Yolk sac:  No  Embryo:  No  Cardiac Activity: N/A  MSD: 3.2 cm   8 w   4  d  Maternal uterus/adnexae: A small amount of subchorionic hemorrhage is noted.  The ovaries are unremarkable in appearance. The right ovary measures 2.7 x 1.9 x 1.7 cm, while the left ovary measures 3.9 x 1.4 x 1.9 cm. No suspicious adnexal masses are seen; there is no evidence for ovarian torsion.  No free fluid is seen within the pelvic  cul-de-sac.  IMPRESSION: 1. Irregularly shaped 3.2 cm gestational sac, without evidence of a yolk sac or embryo. Findings are compatible with a blighted ovum, and meet definitive criteria for failed pregnancy. This follows SRU consensus guidelines: Diagnostic Criteria for Nonviable Pregnancy Early in the First Trimester. Macy Mis Engl J Med [phone] [id number];369:1443-51. 2. Small amount of subchorionic hemorrhage noted.  Electronically Signed   By: Roanna RaiderJeffery  Chang M.D.   On: 01/18/2014 23:40    MAU COURSE Kept NPO; IV LR  0945: Dilaudid 1 mg IVP with little relief  10:15: Dilaudid 1 mg IVP 10:30: Cytotec 800mg  pr Still moderate bleeding and pain> C/W Dr. Penne LashLeggett who will come see pt. 10:55  ASSESSMENT Incomplete SAB  PLAN     Medication List    ASK your doctor about these medications       acetaminophen 500 MG tablet  Commonly known as:  TYLENOL  Take 1,000 mg by mouth every 6 (six) hours as needed for headache.     FOLIC ACID PO  Take 1 tablet by mouth daily.     ibuprofen 600 MG tablet  Commonly known as:  ADVIL,MOTRIN  Take 1 tablet (600 mg total) by mouth every 6 (six) hours as needed for cramping.     OVER THE COUNTER MEDICATION  Take 2 tablets by mouth at bedtime. Sleep aid     prenatal multivitamin Tabs tablet  Take 1 tablet by mouth daily at 12 noon.         Danae Orleanseirdre C Poe, CNM 01/21/2014  8:45 AM   Pt seen and examined.  Pt has soaked several chucks in the MAU and is actively bleeding.  Blood cots or POC in LUS. Patient was counseled regarding need for  D&E.  Risks of surgery including bleeding, infection, injury to surrounding organs, need for additional procedures, possibility of intrauterine scarring which may impair future fertility, risk of retained products which may require further management and other postoperative/anesthesia complications were explained to patient.  Likelihood of success of complete evacuation of the uterus was discussed with the patient.  Written informed  consent was obtained. Anesthesia and OR aware.  Preoperative prophylactic Doxycycline 100mg  IV  has been ordered and is on call to the OR.  To OR when ready.

## 2014-01-22 NOTE — Anesthesia Postprocedure Evaluation (Signed)
Anesthesia Post Note  Patient: Katherine Mcguire  Procedure(s) Performed: Procedure(s) (LRB): DILATATION AND EVACUATION (N/A)  Anesthesia type: General  Patient location: PACU  Post pain: Pain level controlled  Post assessment: Post-op Vital signs reviewed  Last Vitals:  Filed Vitals:   01/21/14 1400  BP: 100/55  Pulse: 88  Temp: 36.7 C  Resp: 18    Post vital signs: Reviewed  Level of consciousness: sedated  Complications: No apparent anesthesia complications

## 2014-01-25 ENCOUNTER — Encounter (HOSPITAL_COMMUNITY): Payer: Self-pay | Admitting: Obstetrics & Gynecology

## 2014-01-25 ENCOUNTER — Emergency Department (HOSPITAL_COMMUNITY)
Admission: EM | Admit: 2014-01-25 | Discharge: 2014-01-25 | Disposition: A | Discharge status: Home / Self Care | Payer: BC Managed Care – PPO | Attending: Emergency Medicine | Admitting: Emergency Medicine

## 2014-01-25 DIAGNOSIS — J45909 Unspecified asthma, uncomplicated: Secondary | ICD-10-CM | POA: Insufficient documentation

## 2014-01-25 DIAGNOSIS — R51 Headache: Secondary | ICD-10-CM | POA: Insufficient documentation

## 2014-01-25 DIAGNOSIS — R519 Headache, unspecified: Secondary | ICD-10-CM

## 2014-01-25 MED ORDER — SODIUM CHLORIDE 0.9 % IV BOLUS (SEPSIS)
1000.0000 mL | Freq: Once | INTRAVENOUS | Status: AC
  Administered 2014-01-25: 1000 mL via INTRAVENOUS

## 2014-01-25 MED ORDER — BUTALBITAL-APAP-CAFFEINE 50-325-40 MG PO TABS: 1.0000 | ORAL_TABLET | Freq: Four times a day (QID) | ORAL | Status: DC | PRN

## 2014-01-25 MED ORDER — DIPHENHYDRAMINE HCL 50 MG/ML IJ SOLN
25.0000 mg | Freq: Once | INTRAMUSCULAR | Status: AC
  Administered 2014-01-25: 25 mg via INTRAVENOUS
  Filled 2014-01-25: qty 1

## 2014-01-25 MED ORDER — METOCLOPRAMIDE HCL 5 MG/ML IJ SOLN
10.0000 mg | Freq: Once | INTRAMUSCULAR | Status: AC
  Administered 2014-01-25: 10 mg via INTRAVENOUS
  Filled 2014-01-25: qty 2

## 2014-01-25 MED ORDER — KETOROLAC TROMETHAMINE 30 MG/ML IJ SOLN
30.0000 mg | Freq: Once | INTRAMUSCULAR | Status: AC
  Administered 2014-01-25: 30 mg via INTRAVENOUS
  Filled 2014-01-25: qty 1

## 2014-01-25 NOTE — ED Provider Notes (Signed)
  Medical screening examination/treatment/procedure(s) were performed by non-physician practitioner and as supervising physician I was immediately available for consultation/collaboration.   EKG Interpretation None         Mechell Girgis, MD 01/25/14 2321 

## 2014-01-25 NOTE — Discharge Instructions (Signed)

## 2014-01-25 NOTE — ED Notes (Signed)
PA Tran at bedside. 

## 2014-01-25 NOTE — ED Notes (Signed)
PT states she has had HA for past month. Pain on forehead and occiput. Noise makes HA worse. Pt states she does not get headaches normally.

## 2014-01-25 NOTE — ED Provider Notes (Signed)
CSN: [id number]     Arrival date & time 01/25/14  1621 History   First MD Initiated Contact with Patient 01/25/14 1657     Chief Complaint  Patient presents with  . Headache     (Consider location/radiation/quality/duration/timing/severity/associated sxs/prior Treatment) HPI 33 year old female [phone] with a blighted ovum requiring surgical completion which was done 5 days ago presents c/o headache.  Patient reports she has had headache ongoing for the past 2-3 months. Headache is persistent, not get any better or worse. Describe headaches as a sharp achy sensation to her forehead behind both of the ears, head and neck. Sleeping makes the headache better her headache is progressively worse throughout the day. Initially she thought it was related to her pregnancy. She has been seen and evaluated by her OB/GYN for this headache over the course of her pregnancy up until she had her D&C procedure last week. [facility] of a headache but they felt that is unrelated to her pregnancy. Due to the duration of her headache, she wanted to have further evaluation. She otherwise denies any fever, chills, or vomiting and diarrhea, numbness, weakness, abnormal weight change, or rash. She has prior history of headache. No history of preeclampsia. Taking ibuprofen or Tylenol did not help. Does endorse some mild light sensitivity.     Past Medical History  Diagnosis Date  . Asthma   . Preterm labor    Past Surgical History  Procedure Laterality Date  . Cesarean section    . Dilation and evacuation N/A 01/21/2014    Procedure: DILATATION AND EVACUATION;  Surgeon: Lesly DukesKelly H Leggett, MD;  Location: [facility];  Service: Gynecology;  Laterality: N/A;   History reviewed. No pertinent family history. History  Substance Use Topics  . Smoking status: Never Smoker   . Smokeless tobacco: Not on file  . Alcohol Use: No   OB History   Grav Para Term  Preterm Abortions TAB SAB Ect Mult Living   6 4 3 1 1 1  0   4     Review of Systems  All other systems reviewed and are negative.     Allergies  Review of patient's allergies indicates no known allergies.  Home Medications   Prior to Admission medications   Medication Sig Start Date End Date Taking? Authorizing Provider  ibuprofen (ADVIL,MOTRIN) 600 MG tablet Take 1 tablet (600 mg total) by mouth every 6 (six) hours as needed for cramping. 01/18/14   Dorathy KinsmanVirginia Smith, CNM   BP 121/81  Pulse 103  Temp(Src) 98.3 F (36.8 C) (Oral)  Resp 16  SpO2 100%  LMP 10/26/2013  Breastfeeding? Unknown Physical Exam  Nursing note and vitals reviewed. Constitutional: She is oriented to person, place, and time. She appears well-developed and well-nourished. No distress.  HENT:  Head: Atraumatic.  Mouth/Throat: Oropharynx is clear and moist.  Eyes: Conjunctivae and EOM are normal.  Neck: Neck supple.  No nuchal rigidity  Cardiovascular: Normal rate and regular rhythm.   Pulmonary/Chest: Effort normal and breath sounds normal. She has no rales.  Abdominal: Soft. There is no tenderness.  Musculoskeletal: She exhibits no edema.  Neurological: She is alert and oriented to person, place, and time. She has normal strength. No cranial nerve deficit or sensory deficit. She displays a negative Romberg sign. Coordination and gait normal. GCS eye subscore is 4. GCS verbal subscore is 5. GCS motor subscore is 6.  Skin: No rash noted.  Psychiatric: She has  a normal mood and affect.    ED Course  Procedures (including critical care time)  5:16 PM Patient here with prolonged headache ongoing for the past 2-3 months. She has no acute sudden onset thunderclap headache concerning for subarachnoid hemorrhage, no fever, nuchal rigidity concerning for meningitis, no numbness weakness or focal neuro deficits concerning for stroke or mass. She appears to be nontoxic and in no acute distress however she endorses  and out of 10 headache. Given the duration of her headache, I suspect patient will get better evaluation through a neurologist which I can give her referral. I offer patient a migraine cocktail and will discharge patient with Fioricet. Patient was understanding and agrees with plan.  6:54 PM Patient reports feeling much better after receiving a migraine cocktail. Request to be discharged. We'll give referral to neurology and will prescribe Fioricet. Return precautions discussed.  Labs Review Labs Reviewed - No data to display  Imaging Review No results found.   EKG Interpretation None      MDM   Final diagnoses:  Headache    BP 117/78  Pulse 97  Temp(Src) 98.3 F (36.8 C) (Oral)  Resp 16  SpO2 99%  LMP 10/26/2013  Breastfeeding? Unknown     Fayrene HelperBowie Filomeno Cromley, PA-C 01/25/14 [id number]

## 2014-03-03 ENCOUNTER — Encounter: Payer: BC Managed Care – PPO | Admitting: Obstetrics & Gynecology

## 2014-05-09 ENCOUNTER — Encounter (HOSPITAL_COMMUNITY): Payer: Self-pay | Admitting: Emergency Medicine

## 2014-05-09 ENCOUNTER — Emergency Department (HOSPITAL_COMMUNITY)
Admission: EM | Admit: 2014-05-09 | Discharge: 2014-05-09 | Disposition: A | Discharge status: Home / Self Care | Payer: BC Managed Care – PPO | Attending: Emergency Medicine | Admitting: Emergency Medicine

## 2014-05-09 DIAGNOSIS — J45909 Unspecified asthma, uncomplicated: Secondary | ICD-10-CM | POA: Insufficient documentation

## 2014-05-09 DIAGNOSIS — Z7982 Long term (current) use of aspirin: Secondary | ICD-10-CM | POA: Insufficient documentation

## 2014-05-09 DIAGNOSIS — Z8751 Personal history of pre-term labor: Secondary | ICD-10-CM | POA: Diagnosis not present

## 2014-05-09 DIAGNOSIS — K089 Disorder of teeth and supporting structures, unspecified: Secondary | ICD-10-CM | POA: Diagnosis present

## 2014-05-09 DIAGNOSIS — K0889 Other specified disorders of teeth and supporting structures: Secondary | ICD-10-CM

## 2014-05-09 DIAGNOSIS — Z79899 Other long term (current) drug therapy: Secondary | ICD-10-CM | POA: Diagnosis not present

## 2014-05-09 MED ORDER — HYDROCODONE-ACETAMINOPHEN 5-325 MG PO TABS: 1.0000 | ORAL_TABLET | Freq: Four times a day (QID) | ORAL | Status: DC | PRN

## 2014-05-09 MED ORDER — PENICILLIN V POTASSIUM 250 MG PO TABS: 250.0000 mg | ORAL_TABLET | Freq: Four times a day (QID) | ORAL | Status: AC

## 2014-05-09 NOTE — Discharge Instructions (Signed)
Dental Pain  Toothache is pain in or around a tooth. It may get worse with chewing or with cold or heat.   HOME CARE  · Your dentist may use a numbing medicine during treatment. If so, you may need to avoid eating until the medicine wears off. Ask your dentist about this.  · Only take medicine as told by your dentist or doctor.  · Avoid chewing food near the painful tooth until after all treatment is done. Ask your dentist about this.  GET HELP RIGHT AWAY IF:   · The problem gets worse or new problems appear.  · You have a fever.  · There is redness and puffiness (swelling) of the face, jaw, or neck.  · You cannot open your mouth.  · There is pain in the jaw.  · There is very bad pain that is not helped by medicine.  MAKE SURE YOU:   · Understand these instructions.  · Will watch your condition.  · Will get help right away if you are not doing well or get worse.  Document Released: 02/27/2008 Document Revised: 12/03/2011 Document Reviewed: 02/27/2008  ExitCare® Patient Information ©2015 [employer]. This information is not intended to replace advice given to you by your health care provider. Make sure you discuss any questions you have with your health care provider.

## 2014-05-09 NOTE — ED Notes (Signed)
Patient states that she has pain to her right upper jaw  - dental

## 2014-05-09 NOTE — ED Provider Notes (Signed)
CSN: [id number]     Arrival date & time 05/09/14  1204 History  This chart was scribed for non-physician practitioner, Teressa LowerVrinda Aysha Livecchi, NP working with Layla MawKristen N Ward, DO by Greggory StallionKayla Andersen, ED scribe. This patient was seen in room WTR6/WTR6 and the patient's care was started at 12:20 PM.   Chief Complaint  Patient presents with  . Dental Pain   The history is provided by the patient. No language interpreter was used.   HPI Comments: Katherine Mcguire is a 33 y.o. female who presents to the Emergency Department complaining of gradual onset right upper dental pain that started 2 days ago. Pt states her tooth is broken and has caused her trouble in the past. She has taken 800 mg ibuprofen and used orajel with no relief. Pt has not seen a dentist. Denies fever, facial swelling. Pt states her menstrual cycle shoulder start next week.   Past Medical History  Diagnosis Date  . Asthma   . Preterm labor    Past Surgical History  Procedure Laterality Date  . Cesarean section    . Dilation and evacuation N/A 01/21/2014    Procedure: DILATATION AND EVACUATION;  Surgeon: Lesly DukesKelly H Leggett, MD;  Location: [facility];  Service: Gynecology;  Laterality: N/A;   No family history on file. History  Substance Use Topics  . Smoking status: Never Smoker   . Smokeless tobacco: Not on file  . Alcohol Use: No   OB History   Grav Para Term Preterm Abortions TAB SAB Ect Mult Living   6 4 3 1 1 1  0   4     Review of Systems  Constitutional: Negative for fever.  HENT: Positive for dental problem. Negative for facial swelling.   All other systems reviewed and are negative.  Allergies  Review of patient's allergies indicates no known allergies.  Home Medications   Prior to Admission medications   Medication Sig Start Date End Date Taking? Authorizing Provider  acetaminophen (TYLENOL) 500 MG tablet Take 1,000 mg by mouth every 6 (six) hours as needed for mild pain.    Historical Provider, MD   Aspirin-Acetaminophen-Caffeine (EXCEDRIN PO) Take 1 tablet by mouth daily as needed (pain.).    Historical Provider, MD  butalbital-acetaminophen-caffeine [facility](FIORICET) [phone] MG per tablet Take 1-2 tablets by mouth every 6 (six) hours as needed for headache. 01/25/14 01/25/15  Fayrene HelperBowie Tran, PA-C  ibuprofen (ADVIL,MOTRIN) 600 MG tablet Take 1 tablet (600 mg total) by mouth every 6 (six) hours as needed for cramping. 01/18/14   Dorathy KinsmanVirginia Smith, CNM   LMP 10/26/2013  Physical Exam  Nursing note and vitals reviewed. Constitutional: She is oriented to person, place, and time. She appears well-developed and well-nourished. No distress.  HENT:  Head: Normocephalic and atraumatic.  Right Ear: Tympanic membrane and ear canal normal.  Left Ear: Tympanic membrane and ear canal normal.  Mouth/Throat: Oropharynx is clear and moist.    Fracture tooth, which appears old and some retained pieces, no gum or facial swelling  Eyes: Conjunctivae and EOM are normal.  Neck: Neck supple. No tracheal deviation present.  Cardiovascular: Normal rate, regular rhythm and normal heart sounds.   Pulmonary/Chest: Effort normal and breath sounds normal. No respiratory distress. She has no wheezes. She has no rales.  Musculoskeletal: Normal range of motion.  Neurological: She is alert and oriented to person, place, and time.  Skin: Skin is warm and dry.  Psychiatric: She has a normal mood and affect. Her behavior is normal.  ED Course  Procedures (including critical care time)  COORDINATION OF CARE: 12:21 PM-Discussed treatment plan which includes an antibiotic and pain medication with pt at bedside and pt agreed to plan. Will give pt dental referrals and advised her to follow up.   Labs Review Labs Reviewed - No data to display  Imaging Review No results found.   EKG Interpretation None      MDM   Final diagnoses:  Toothache    No sign of oral swelling or ludwigs angina  I personally performed the  services described in this documentation, which was scribed in my presence. The recorded information has been reviewed and is accurate.  Teressa Lower, NP 05/09/14 1242

## 2014-05-09 NOTE — ED Provider Notes (Signed)
Medical screening examination/treatment/procedure(s) were performed by non-physician practitioner and as supervising physician I was immediately available for consultation/collaboration.   EKG Interpretation None        Kristen N Ward, DO 05/09/14 1508 

## 2014-06-01 ENCOUNTER — Inpatient Hospital Stay (HOSPITAL_COMMUNITY)
Admission: AD | Admit: 2014-06-01 | Discharge: 2014-06-02 | Disposition: A | Discharge status: Home / Self Care | Payer: BC Managed Care – PPO | Source: Ambulatory Visit | Attending: Obstetrics | Admitting: Obstetrics

## 2014-06-01 DIAGNOSIS — N949 Unspecified condition associated with female genital organs and menstrual cycle: Secondary | ICD-10-CM | POA: Diagnosis not present

## 2014-06-01 DIAGNOSIS — O99891 Other specified diseases and conditions complicating pregnancy: Secondary | ICD-10-CM | POA: Diagnosis not present

## 2014-06-01 DIAGNOSIS — R109 Unspecified abdominal pain: Secondary | ICD-10-CM | POA: Insufficient documentation

## 2014-06-01 DIAGNOSIS — O9989 Other specified diseases and conditions complicating pregnancy, childbirth and the puerperium: Secondary | ICD-10-CM | POA: Diagnosis not present

## 2014-06-01 DIAGNOSIS — O26891 Other specified pregnancy related conditions, first trimester: Secondary | ICD-10-CM

## 2014-06-01 DIAGNOSIS — R102 Pelvic and perineal pain: Secondary | ICD-10-CM

## 2014-06-01 LAB — CBC
HEMATOCRIT: 32.2 % — AB (ref 36.0–46.0)
HEMOGLOBIN: 10.5 g/dL — AB (ref 12.0–15.0)
MCH: 25.4 pg — ABNORMAL LOW (ref 26.0–34.0)
MCHC: 32.6 g/dL (ref 30.0–36.0)
MCV: 78 fL (ref 78.0–100.0)
Platelets: 318 10*3/uL (ref 150–400)
RBC: 4.13 MIL/uL (ref 3.87–5.11)
RDW: 21.7 % — ABNORMAL HIGH (ref 11.5–15.5)
WBC: 9.9 10*3/uL (ref 4.0–10.5)

## 2014-06-01 LAB — URINALYSIS, ROUTINE W REFLEX MICROSCOPIC
Bilirubin Urine: NEGATIVE
GLUCOSE, UA: 100 mg/dL — AB
HGB URINE DIPSTICK: NEGATIVE
KETONES UR: NEGATIVE mg/dL
LEUKOCYTES UA: NEGATIVE
Nitrite: NEGATIVE
PH: 6 (ref 5.0–8.0)
Protein, ur: NEGATIVE mg/dL
Specific Gravity, Urine: 1.025 (ref 1.005–1.030)
Urobilinogen, UA: 0.2 mg/dL (ref 0.0–1.0)

## 2014-06-01 LAB — POCT PREGNANCY, URINE: Preg Test, Ur: POSITIVE — AB

## 2014-06-01 NOTE — MAU Note (Signed)
Pt reports cramping on right side for the last 5 hours, spotting.

## 2014-06-02 ENCOUNTER — Encounter (HOSPITAL_COMMUNITY): Payer: Self-pay | Admitting: Pediatrics

## 2014-06-02 ENCOUNTER — Inpatient Hospital Stay (HOSPITAL_COMMUNITY): Payer: BC Managed Care – PPO

## 2014-06-02 DIAGNOSIS — O9989 Other specified diseases and conditions complicating pregnancy, childbirth and the puerperium: Secondary | ICD-10-CM | POA: Diagnosis not present

## 2014-06-02 DIAGNOSIS — N949 Unspecified condition associated with female genital organs and menstrual cycle: Secondary | ICD-10-CM

## 2014-06-02 LAB — HCG, QUANTITATIVE, PREGNANCY: hCG, Beta Chain, Quant, S: 88438 m[IU]/mL — ABNORMAL HIGH (ref <5)

## 2014-06-02 NOTE — MAU Note (Signed)
Pt states she started having abdominal cramping on her right side starting at 1300 on 06/01/14.

## 2014-06-02 NOTE — Discharge Instructions (Signed)
First Trimester of Pregnancy The first trimester of pregnancy is from week 1 until the end of week 12 (months 1 through 3). A week after a sperm fertilizes an egg, the egg will implant on the wall of the uterus. This embryo will begin to develop into a baby. Genes from you and your partner are forming the baby. The female genes determine whether the baby is a boy or a girl. At 6-8 weeks, the eyes and face are formed, and the heartbeat can be seen on ultrasound. At the end of 12 weeks, all the baby's organs are formed.  Now that you are pregnant, you will want to do everything you can to have a healthy baby. Two of the most important things are to get good prenatal care and to follow your health care provider's instructions. Prenatal care is all the medical care you receive before the baby's birth. This care will help prevent, find, and treat any problems during the pregnancy and childbirth. BODY CHANGES Your body goes through many changes during pregnancy. The changes vary from woman to woman.   You may gain or lose a couple of pounds at first.  You may feel sick to your stomach (nauseous) and throw up (vomit). If the vomiting is uncontrollable, call your health care provider.  You may tire easily.  You may develop headaches that can be relieved by medicines approved by your health care provider.  You may urinate more often. Painful urination may mean you have a bladder infection.  You may develop heartburn as a result of your pregnancy.  You may develop constipation because certain hormones are causing the muscles that push waste through your intestines to slow down.  You may develop hemorrhoids or swollen, bulging veins (varicose veins).  Your breasts may begin to grow larger and become tender. Your nipples may stick out more, and the tissue that surrounds them (areola) may become darker.  Your gums may bleed and may be sensitive to brushing and flossing.  Dark spots or blotches (chloasma,  mask of pregnancy) may develop on your face. This will likely fade after the baby is born.  Your menstrual periods will stop.  You may have a loss of appetite.  You may develop cravings for certain kinds of food.  You may have changes in your emotions from day to day, such as being excited to be pregnant or being concerned that something may go wrong with the pregnancy and baby.  You may have more vivid and strange dreams.  You may have changes in your hair. These can include thickening of your hair, rapid growth, and changes in texture. Some women also have hair loss during or after pregnancy, or hair that feels dry or thin. Your hair will most likely return to normal after your baby is born. WHAT TO EXPECT AT YOUR PRENATAL VISITS During a routine prenatal visit:  You will be weighed to make sure you and the baby are growing normally.  Your blood pressure will be taken.  Your abdomen will be measured to track your baby's growth.  The fetal heartbeat will be listened to starting around week 10 or 12 of your pregnancy.  Test results from any previous visits will be discussed. Your health care provider may ask you:  How you are feeling.  If you are feeling the baby move.  If you have had any abnormal symptoms, such as leaking fluid, bleeding, severe headaches, or abdominal cramping.  If you have any questions. Other tests   that may be performed during your first trimester include:  Blood tests to find your blood type and to check for the presence of any previous infections. They will also be used to check for low iron levels (anemia) and Rh antibodies. Later in the pregnancy, blood tests for diabetes will be done along with other tests if problems develop.  Urine tests to check for infections, diabetes, or protein in the urine.  An ultrasound to confirm the proper growth and development of the baby.  An amniocentesis to check for possible genetic problems.  Fetal screens for  spina bifida and Down syndrome.  You may need other tests to make sure you and the baby are doing well. HOME CARE INSTRUCTIONS  Medicines  Follow your health care provider's instructions regarding medicine use. Specific medicines may be either safe or unsafe to take during pregnancy.  Take your prenatal vitamins as directed.  If you develop constipation, try taking a stool softener if your health care provider approves. Diet  Eat regular, well-balanced meals. Choose a variety of foods, such as meat or vegetable-based protein, fish, milk and low-fat dairy products, vegetables, fruits, and whole grain breads and cereals. Your health care provider will help you determine the amount of weight gain that is right for you.  Avoid raw meat and uncooked cheese. These carry germs that can cause birth defects in the baby.  Eating four or five small meals rather than three large meals a day may help relieve nausea and vomiting. If you start to feel nauseous, eating a few soda crackers can be helpful. Drinking liquids between meals instead of during meals also seems to help nausea and vomiting.  If you develop constipation, eat more high-fiber foods, such as fresh vegetables or fruit and whole grains. Drink enough fluids to keep your urine clear or pale yellow. Activity and Exercise  Exercise only as directed by your health care provider. Exercising will help you:  Control your weight.  Stay in shape.  Be prepared for labor and delivery.  Experiencing pain or cramping in the lower abdomen or low back is a good sign that you should stop exercising. Check with your health care provider before continuing normal exercises.  Try to avoid standing for long periods of time. Move your legs often if you must stand in one place for a long time.  Avoid heavy lifting.  Wear low-heeled shoes, and practice good posture.  You may continue to have sex unless your health care provider directs you  otherwise. Relief of Pain or Discomfort  Wear a good support bra for breast tenderness.   Take warm sitz baths to soothe any pain or discomfort caused by hemorrhoids. Use hemorrhoid cream if your health care provider approves.   Rest with your legs elevated if you have leg cramps or low back pain.  If you develop varicose veins in your legs, wear support hose. Elevate your feet for 15 minutes, 3-4 times a day. Limit salt in your diet. Prenatal Care  Schedule your prenatal visits by the twelfth week of pregnancy. They are usually scheduled monthly at first, then more often in the last 2 months before delivery.  Write down your questions. Take them to your prenatal visits.  Keep all your prenatal visits as directed by your health care provider. Safety  Wear your seat belt at all times when driving.  Make a list of emergency phone numbers, including numbers for family, friends, the hospital, and police and fire departments. General Tips    Ask your health care provider for a referral to a local prenatal education class. Begin classes no later than at the beginning of month 6 of your pregnancy.  Ask for help if you have counseling or nutritional needs during pregnancy. Your health care provider can offer advice or refer you to specialists for help with various needs.  Do not use hot tubs, steam rooms, or saunas.  Do not douche or use tampons or scented sanitary pads.  Do not cross your legs for long periods of time.  Avoid cat litter boxes and soil used by cats. These carry germs that can cause birth defects in the baby and possibly loss of the fetus by miscarriage or stillbirth.  Avoid all smoking, herbs, alcohol, and medicines not prescribed by your health care provider. Chemicals in these affect the formation and growth of the baby.  Schedule a dentist appointment. At home, brush your teeth with a soft toothbrush and be gentle when you floss. SEEK MEDICAL CARE IF:   You have  dizziness.  You have mild pelvic cramps, pelvic pressure, or nagging pain in the abdominal area.  You have persistent nausea, vomiting, or diarrhea.  You have a bad smelling vaginal discharge.  You have pain with urination.  You notice increased swelling in your face, hands, legs, or ankles. SEEK IMMEDIATE MEDICAL CARE IF:   You have a fever.  You are leaking fluid from your vagina.  You have spotting or bleeding from your vagina.  You have severe abdominal cramping or pain.  You have rapid weight gain or loss.  You vomit blood or material that looks like coffee grounds.  You are exposed to German measles and have never had them.  You are exposed to fifth disease or chickenpox.  You develop a severe headache.  You have shortness of breath.  You have any kind of trauma, such as from a fall or a car accident. Document Released: 09/04/2001 Document Revised: 01/25/2014 Document Reviewed: 07/21/2013 Exit[employer] Patient Information 2015 [employer]. This information is not intended to replace advice given to you by your health care provider. Make sure you discuss any questions you have with your health care provider.  

## 2014-06-02 NOTE — MAU Provider Note (Signed)
History     CSN: [id number]  Arrival date and time: 06/01/14 2206   None     No chief complaint on file.  HPI  Katherine Mcguire is a 33 y.o. [id number] at Unknown who presents today with cramping. She states that she had some spotting. She has taken ibuprofen, but it has not helped. She has an appointment with Dr. Gaynell Face on Thursday.   Past Medical History  Diagnosis Date  . Asthma   . Preterm labor     Past Surgical History  Procedure Laterality Date  . Cesarean section    . Dilation and evacuation N/A 01/21/2014    Procedure: DILATATION AND EVACUATION;  Surgeon: Lesly Dukes, MD;  Location: [facility];  Service: Gynecology;  Laterality: N/A;    No family history on file.  History  Substance Use Topics  . Smoking status: Never Smoker   . Smokeless tobacco: Not on file  . Alcohol Use: No    Allergies: No Known Allergies  Prescriptions prior to admission  Medication Sig Dispense Refill  . acetaminophen (TYLENOL) 500 MG tablet Take 1,000 mg by mouth every 6 (six) hours as needed for mild pain.      . Aspirin-Acetaminophen-Caffeine (EXCEDRIN PO) Take 1 tablet by mouth daily as needed (pain.).      Marland Kitchen butalbital-acetaminophen-caffeine (FIORICET) 50-325-40 MG per tablet Take 1-2 tablets by mouth every 6 (six) hours as needed for headache.  20 tablet  0  . HYDROcodone-acetaminophen (NORCO/VICODIN) 5-325 MG per tablet Take 1-2 tablets by mouth every 6 (six) hours as needed.  10 tablet  0  . ibuprofen (ADVIL,MOTRIN) 600 MG tablet Take 1 tablet (600 mg total) by mouth every 6 (six) hours as needed for cramping.  30 tablet  1    ROS Physical Exam   Blood pressure 121/60, pulse 111, temperature 99.4 F (37.4 C), temperature source Oral, resp. rate 18, height  (1.651 m), weight 72.122 kg (159 lb), SpO2 100.00%, unknown if currently breastfeeding.  Physical Exam  Nursing note and vitals reviewed. Constitutional: She is oriented to person, place, and time. She appears  well-developed and well-nourished. No distress.  Cardiovascular: Normal rate.   Respiratory: Effort normal.  GI: Soft. There is no tenderness.  Neurological: She is alert and oriented to person, place, and time.  Skin: Skin is warm and dry.  Psychiatric: She has a normal mood and affect.    MAU Course  Procedures  Results for orders placed during the hospital encounter of 06/01/14 (from the past 24 hour(s))  URINALYSIS, ROUTINE W REFLEX MICROSCOPIC     Status: Abnormal   Collection Time    06/01/14 10:52 PM      Result Value Ref Range   Color, Urine YELLOW  YELLOW   APPearance CLEAR  CLEAR   Specific Gravity, Urine 1.025  1.005 - 1.030   pH 6.0  5.0 - 8.0   Glucose, UA 100 (*) NEGATIVE mg/dL   Hgb urine dipstick NEGATIVE  NEGATIVE   Bilirubin Urine NEGATIVE  NEGATIVE   Ketones, ur NEGATIVE  NEGATIVE mg/dL   Protein, ur NEGATIVE  NEGATIVE mg/dL   Urobilinogen, UA 0.2  0.0 - 1.0 mg/dL   Nitrite NEGATIVE  NEGATIVE   Leukocytes, UA NEGATIVE  NEGATIVE  POCT PREGNANCY, URINE     Status: Abnormal   Collection Time    06/01/14 11:13 PM      Result Value Ref Range   Preg Test, Ur POSITIVE (*) NEGATIVE  CBC  Status: Abnormal   Collection Time    06/01/14 11:23 PM      Result Value Ref Range   WBC 9.9  4.0 - 10.5 K/uL   RBC 4.13  3.87 - 5.11 MIL/uL   Hemoglobin 10.5 (*) 12.0 - 15.0 g/dL   HCT 16.1 (*) 09.6 - 04.5 %   MCV 78.0  78.0 - 100.0 fL   MCH 25.4 (*) 26.0 - 34.0 pg   MCHC 32.6  30.0 - 36.0 g/dL   RDW 40.9 (*) 81.1 - 91.4 %   Platelets 318  150 - 400 K/uL  HCG, QUANTITATIVE, PREGNANCY     Status: Abnormal   Collection Time    06/01/14 11:23 PM      Result Value Ref Range   hCG, Beta Chain, Sharene Butters, [place] [address] (*) <5 mIU/mL   US Ob Comp Less 14 Wks  06/02/2014   CLINICAL DATA:  Pregnant, cramping, bleeding  EXAM: OBSTETRIC <14 WK ULTRASOUND  TECHNIQUE: Transabdominal ultrasound was performed for evaluation of the gestation as well as the maternal uterus and adnexal  regions.  COMPARISON:  None.  FINDINGS: Intrauterine gestational sac: Visualized/normal in shape.  Yolk sac:  Present  Embryo:  Present  Cardiac Activity: Present  Heart Rate: 150 bpm  CRL:   27.1  mm   9 w 4 d                  [place] EDC: 01/01/2015  Maternal uterus/adnexae: No subchorionic hemorrhage.  Right ovary is within normal limits, measuring 2.8 x 2.3 x 2.3 cm.  Left ovary measures 4.3 x 3.4 x 3.0 cm and is notable for a 2.7 x 2.5 x 2.4 cm simple cyst.  No free fluid.  IMPRESSION: Single live intrauterine gestation measuring 9 weeks 4 days by crown-rump length.   Electronically Signed   By: Charline Bills M.D.   On: 06/02/2014 00:29     Assessment and Plan   1. Pelvic pain affecting pregnancy in first trimester, antepartum    Bleeding precautions  First trimester precautions Return to MAU as needed  Follow-up Information   Schedule an appointment as soon as possible for a visit with Kathreen Cosier, MD.   Specialty:  Obstetrics and Gynecology   Contact information:   [address] [place] [address] [phone]        Tawnya Crook 06/02/2014, 2:22 AM

## 2014-07-13 ENCOUNTER — Encounter (HOSPITAL_COMMUNITY): Payer: Self-pay

## 2014-07-13 ENCOUNTER — Inpatient Hospital Stay (HOSPITAL_COMMUNITY): Payer: BC Managed Care – PPO

## 2014-07-13 ENCOUNTER — Inpatient Hospital Stay (HOSPITAL_COMMUNITY)
Admission: AD | Admit: 2014-07-13 | Discharge: 2014-07-13 | Disposition: A | Discharge status: Home / Self Care | Payer: BC Managed Care – PPO | Source: Ambulatory Visit | Attending: Obstetrics | Admitting: Obstetrics

## 2014-07-13 DIAGNOSIS — Z3A15 15 weeks gestation of pregnancy: Secondary | ICD-10-CM

## 2014-07-13 DIAGNOSIS — O9989 Other specified diseases and conditions complicating pregnancy, childbirth and the puerperium: Secondary | ICD-10-CM

## 2014-07-13 DIAGNOSIS — O26899 Other specified pregnancy related conditions, unspecified trimester: Secondary | ICD-10-CM

## 2014-07-13 DIAGNOSIS — R109 Unspecified abdominal pain: Secondary | ICD-10-CM | POA: Diagnosis not present

## 2014-07-13 LAB — CBC WITH DIFFERENTIAL/PLATELET
BASOS PCT: 0 % (ref 0–1)
Basophils Absolute: 0 10*3/uL (ref 0.0–0.1)
Eosinophils Absolute: 0.2 10*3/uL (ref 0.0–0.7)
Eosinophils Relative: 3 % (ref 0–5)
HEMATOCRIT: 32.5 % — AB (ref 36.0–46.0)
HEMOGLOBIN: 10.8 g/dL — AB (ref 12.0–15.0)
LYMPHS ABS: 2.2 10*3/uL (ref 0.7–4.0)
LYMPHS PCT: 27 % (ref 12–46)
MCH: 27.8 pg (ref 26.0–34.0)
MCHC: 33.2 g/dL (ref 30.0–36.0)
MCV: 83.8 fL (ref 78.0–100.0)
MONOS PCT: 7 % (ref 3–12)
Monocytes Absolute: 0.6 10*3/uL (ref 0.1–1.0)
NEUTROS ABS: 5.2 10*3/uL (ref 1.7–7.7)
Neutrophils Relative %: 63 % (ref 43–77)
Platelets: 258 10*3/uL (ref 150–400)
RBC: 3.88 MIL/uL (ref 3.87–5.11)
RDW: 18 % — ABNORMAL HIGH (ref 11.5–15.5)
WBC: 8.2 10*3/uL (ref 4.0–10.5)

## 2014-07-13 LAB — COMPREHENSIVE METABOLIC PANEL
ALBUMIN: 3 g/dL — AB (ref 3.5–5.2)
ALK PHOS: 48 U/L (ref 39–117)
ALT: 11 U/L (ref 0–35)
AST: 13 U/L (ref 0–37)
Anion gap: 10 (ref 5–15)
BUN: 4 mg/dL — AB (ref 6–23)
CO2: 21 meq/L (ref 19–32)
Calcium: 8.6 mg/dL (ref 8.4–10.5)
Chloride: 103 mEq/L (ref 96–112)
Creatinine, Ser: 0.44 mg/dL — ABNORMAL LOW (ref 0.50–1.10)
GFR calc Af Amer: >90 mL/min (ref >90)
Glucose, Bld: 92 mg/dL (ref 70–99)
POTASSIUM: 3.6 meq/L — AB (ref 3.7–5.3)
Sodium: 134 mEq/L — ABNORMAL LOW (ref 137–147)
Total Protein: 6.6 g/dL (ref 6.0–8.3)

## 2014-07-13 LAB — URINE MICROSCOPIC-ADD ON

## 2014-07-13 LAB — URINALYSIS, ROUTINE W REFLEX MICROSCOPIC
Bilirubin Urine: NEGATIVE
GLUCOSE, UA: 100 mg/dL — AB
Hgb urine dipstick: NEGATIVE
Ketones, ur: NEGATIVE mg/dL
NITRITE: NEGATIVE
PH: 7 (ref 5.0–8.0)
Protein, ur: NEGATIVE mg/dL
SPECIFIC GRAVITY, URINE: 1.01 (ref 1.005–1.030)
Urobilinogen, UA: 0.2 mg/dL (ref 0.0–1.0)

## 2014-07-13 MED ORDER — CYCLOBENZAPRINE HCL 10 MG PO TABS: 10.0000 mg | ORAL_TABLET | Freq: Two times a day (BID) | ORAL | Status: DC | PRN

## 2014-07-13 MED ORDER — CYCLOBENZAPRINE HCL 10 MG PO TABS
10.0000 mg | ORAL_TABLET | Freq: Once | ORAL | Status: AC
  Administered 2014-07-13: 10 mg via ORAL
  Filled 2014-07-13: qty 1

## 2014-07-13 NOTE — MAU Provider Note (Signed)
History     CSN: [id number]  Arrival date and time: 07/13/14 1514   First Provider Initiated Contact with Patient 07/13/14 1549      Chief Complaint  Patient presents with  . Abdominal Pain   HPI Katherine Mcguire is a 33 y.o. [id number] at [redacted]w[redacted]d who presents to MAU today with complaint of abdominal pain and leg weakness and tingling. The patient has taken Tylenol, Motrin, PO hydration and tried to lay on her side and continues to have pain. She states that pain is all the time, but worse with ambulation. She also endorses tingling in her legs bilaterally. She denies vaginal bleeding, itching, UTI symptoms or fever. She does endorse a small amount of yellow discharge. She states that she had to have a C/S preterm for a previous pregnancy because "I was cramping and having diarrhea all the time so Dr. Gaynell Face decided to take the baby."   OB History   Grav Para Term Preterm Abortions TAB SAB Ect Mult Living   7 4 3 1 1 1  0   4      Past Medical History  Diagnosis Date  . Asthma   . Preterm labor     Past Surgical History  Procedure Laterality Date  . Cesarean section    . Dilation and evacuation N/A 01/21/2014    Procedure: DILATATION AND EVACUATION;  Surgeon: Lesly Dukes, MD;  Location: [facility];  Service: Gynecology;  Laterality: N/A;    Family History  Problem Relation Age of Onset  . Hypertension Mother     History  Substance Use Topics  . Smoking status: Never Smoker   . Smokeless tobacco: Not on file  . Alcohol Use: No    Allergies: No Known Allergies  Prescriptions prior to admission  Medication Sig Dispense Refill  . acetaminophen (TYLENOL) 500 MG tablet Take 1,000 mg by mouth every 6 (six) hours as needed for mild pain.      . butalbital-acetaminophen-caffeine (FIORICET) 50-325-40 MG per tablet Take 1-2 tablets by mouth every 6 (six) hours as needed for headache.  20 tablet  0  . Prenatal Vit-Fe Fumarate-FA (PRENATAL MULTIVITAMIN) TABS tablet Take 1 tablet  by mouth daily at 12 noon.        Review of Systems  Constitutional: Negative for fever and malaise/fatigue.  Gastrointestinal: Positive for abdominal pain. Negative for nausea, vomiting, diarrhea and constipation.  Genitourinary: Negative for dysuria, urgency and frequency.  Musculoskeletal: Positive for back pain.   Physical Exam   Blood pressure 124/68, pulse 108, temperature 99.1 F (37.3 C), temperature source Oral, resp. rate 16, height 5\' 5"  (1.651 m), weight 161 lb 12.8 oz (73.392 kg), SpO2 100.00%, unknown if currently breastfeeding.  Physical Exam  Constitutional: She is oriented to person, place, and time. She appears well-developed and well-nourished. No distress.  HENT:  Head: Normocephalic.  Cardiovascular: Normal rate and intact distal pulses.   Respiratory: Effort normal.  GI: Soft. She exhibits no distension and no mass. There is tenderness (mild tenderness to palpation of the lower abdomen bilaterally). There is no rebound and no guarding.  Musculoskeletal:       Lumbar back: She exhibits tenderness. She exhibits normal range of motion, no swelling, no edema and no pain.  Neurological: She is alert and oriented to person, place, and time. She has normal strength and normal reflexes.  Skin: Skin is warm and dry. No erythema.  Psychiatric: She has a normal mood and affect.   Results for orders  placed during the hospital encounter of 07/13/14 (from the past 24 hour(s))  URINALYSIS, ROUTINE W REFLEX MICROSCOPIC     Status: Abnormal   Collection Time    07/13/14  3:30 PM      Result Value Ref Range   Color, Urine STRAW (*) YELLOW   APPearance CLEAR  CLEAR   Specific Gravity, Urine 1.010  1.005 - 1.030   pH 7.0  5.0 - 8.0   Glucose, UA 100 (*) NEGATIVE mg/dL   Hgb urine dipstick NEGATIVE  NEGATIVE   Bilirubin Urine NEGATIVE  NEGATIVE   Ketones, ur NEGATIVE  NEGATIVE mg/dL   Protein, ur NEGATIVE  NEGATIVE mg/dL   Urobilinogen, UA 0.2  0.0 - 1.0 mg/dL   Nitrite  NEGATIVE  NEGATIVE   Leukocytes, UA MODERATE (*) NEGATIVE  URINE MICROSCOPIC-ADD ON     Status: Abnormal   Collection Time    07/13/14  3:30 PM      Result Value Ref Range   Squamous Epithelial / LPF FEW (*) RARE   WBC, UA 0-2  <3 WBC/hpf   Bacteria, UA FEW (*) RARE  CBC WITH DIFFERENTIAL     Status: Abnormal   Collection Time    07/13/14  4:21 PM      Result Value Ref Range   WBC 8.2  4.0 - 10.5 K/uL   RBC 3.88  3.87 - 5.11 MIL/uL   Hemoglobin 10.8 (*) 12.0 - 15.0 g/dL   HCT 40.932.5 (*) 81.136.0 - 91.446.0 %   MCV 83.8  78.0 - 100.0 fL   MCH 27.8  26.0 - 34.0 pg   MCHC 33.2  30.0 - 36.0 g/dL   RDW 78.218.0 (*) 95.611.5 - 21.315.5 %   Platelets 258  150 - 400 K/uL   Neutrophils Relative % 63  43 - 77 %   Neutro Abs 5.2  1.7 - 7.7 K/uL   Lymphocytes Relative 27  12 - 46 %   Lymphs Abs 2.2  0.7 - 4.0 K/uL   Monocytes Relative 7  3 - 12 %   Monocytes Absolute 0.6  0.1 - 1.0 K/uL   Eosinophils Relative 3  0 - 5 %   Eosinophils Absolute 0.2  0.0 - 0.7 K/uL   Basophils Relative 0  0 - 1 %   Basophils Absolute 0.0  0.0 - 0.1 K/uL  COMPREHENSIVE METABOLIC PANEL     Status: Abnormal   Collection Time    07/13/14  4:21 PM      Result Value Ref Range   Sodium 134 (*) 137 - 147 mEq/L   Potassium 3.6 (*) 3.7 - 5.3 mEq/L   Chloride 103  96 - 112 mEq/L   CO2 21  19 - 32 mEq/L   Glucose, Bld 92  70 - 99 mg/dL   BUN 4 (*) 6 - 23 mg/dL   Creatinine, Ser 0.860.44 (*) 0.50 - 1.10 mg/dL   Calcium 8.6  8.4 - 57.810.5 mg/dL   Total Protein 6.6  6.0 - 8.3 g/dL   Albumin 3.0 (*) 3.5 - 5.2 g/dL   AST 13  0 - 37 U/L   ALT 11  0 - 35 U/L   Alkaline Phosphatase 48  39 - 117 U/L   Total Bilirubin <0.2 (*) 0.3 - 1.2 mg/dL   GFR calc non Af Amer >90  >90 mL/min   GFR calc Af Amer >90  >90 mL/min   Anion gap 10  5 - 15    MAU Course  Procedures None  MDM FHR - 150 bpm UA, CBC, CMP today Flexeril given in MAU - patient states some improvement in pain OB limited US ordered - cervical length 4.8 cm, normal AFI, small  simple cyst on the left ovary.   Assessment and Plan  A: SIUP at 6671w3d Abdominal pain in pregnancy  P: Discharge home Rx for Flexeril given to patient Discussed warm bath/shower, abdominal binder and moderation of activity Urine culture pending Patient advised to follow-up with Dr. Gaynell FaceMarshall as scheduled Patient may return to MAU as needed or if her condition were to change or worsen   Marny LowensteinJulie N Roston Grunewald, PA-C  07/13/2014, 5:41 PM

## 2014-07-13 NOTE — MAU Note (Signed)
Patient states she started having pain on her right side last night and her legs are numb. Denies nausea, vomiting, bleeding or discharge.

## 2014-07-13 NOTE — Discharge Instructions (Signed)
Abdominal Pain During Pregnancy °Belly (abdominal) pain is common during pregnancy. Most of the time, it is not a serious problem. Other times, it can be a sign that something is wrong with the pregnancy. Always tell your doctor if you have belly pain. °HOME CARE °Monitor your belly pain for any changes. The following actions may help you feel better: °· Do not have sex (intercourse) or put anything in your vagina until you feel better. °· Rest until your pain stops. °· Drink clear fluids if you feel sick to your stomach (nauseous). Do not eat solid food until you feel better. °· Only take medicine as told by your doctor. °· Keep all doctor visits as told. °GET HELP RIGHT AWAY IF:  °· You are bleeding, leaking fluid, or pieces of tissue come out of your vagina. °· You have more pain or cramping. °· You keep throwing up (vomiting). °· You have pain when you pee (urinate) or have blood in your pee. °· You have a fever. °· You do not feel your baby moving as much. °· You feel very weak or feel like passing out. °· You have trouble breathing, with or without belly pain. °· You have a very bad headache and belly pain. °· You have fluid leaking from your vagina and belly pain. °· You keep having watery poop (diarrhea). °· Your belly pain does not go away after resting, or the pain gets worse. °MAKE SURE YOU:  °· Understand these instructions. °· Will watch your condition. °· Will get help right away if you are not doing well or get worse. °Document Released: 08/29/2009 Document Revised: 05/13/2013 Document Reviewed: 04/09/2013 °ExitCare® Patient Information ©2015 [employer]. This information is not intended to replace advice given to you by your health care provider. Make sure you discuss any questions you have with your health care provider. ° °

## 2014-07-14 LAB — CULTURE, OB URINE
COLONY COUNT: NO GROWTH
Culture: NO GROWTH

## 2014-07-26 ENCOUNTER — Encounter (HOSPITAL_COMMUNITY): Payer: Self-pay

## 2014-08-11 ENCOUNTER — Emergency Department (HOSPITAL_COMMUNITY)
Admission: EM | Admit: 2014-08-11 | Discharge: 2014-08-11 | Disposition: A | Discharge status: Home / Self Care | Payer: BC Managed Care – PPO | Attending: Emergency Medicine | Admitting: Emergency Medicine

## 2014-08-11 ENCOUNTER — Encounter (HOSPITAL_COMMUNITY): Payer: Self-pay | Admitting: Emergency Medicine

## 2014-08-11 ENCOUNTER — Emergency Department (HOSPITAL_COMMUNITY): Payer: BC Managed Care – PPO

## 2014-08-11 DIAGNOSIS — J4531 Mild persistent asthma with (acute) exacerbation: Secondary | ICD-10-CM | POA: Insufficient documentation

## 2014-08-11 DIAGNOSIS — J069 Acute upper respiratory infection, unspecified: Secondary | ICD-10-CM | POA: Insufficient documentation

## 2014-08-11 DIAGNOSIS — R079 Chest pain, unspecified: Secondary | ICD-10-CM | POA: Diagnosis present

## 2014-08-11 MED ORDER — IPRATROPIUM BROMIDE 0.02 % IN SOLN
0.5000 mg | Freq: Once | RESPIRATORY_TRACT | Status: AC
  Administered 2014-08-11: 0.5 mg via RESPIRATORY_TRACT
  Filled 2014-08-11: qty 2.5

## 2014-08-11 MED ORDER — ALBUTEROL SULFATE (2.5 MG/3ML) 0.083% IN NEBU
5.0000 mg | INHALATION_SOLUTION | Freq: Once | RESPIRATORY_TRACT | Status: AC
  Administered 2014-08-11: 5 mg via RESPIRATORY_TRACT
  Filled 2014-08-11: qty 6

## 2014-08-11 MED ORDER — ACETAMINOPHEN 500 MG PO TABS
1000.0000 mg | ORAL_TABLET | Freq: Once | ORAL | Status: AC
  Administered 2014-08-11: 1000 mg via ORAL
  Filled 2014-08-11: qty 2

## 2014-08-11 MED ORDER — PREDNISONE 20 MG PO TABS: 40.0000 mg | ORAL_TABLET | Freq: Every day | ORAL | Status: DC

## 2014-08-11 MED ORDER — PREDNISONE 20 MG PO TABS
60.0000 mg | ORAL_TABLET | Freq: Once | ORAL | Status: AC
  Administered 2014-08-11: 60 mg via ORAL
  Filled 2014-08-11: qty 3

## 2014-08-11 MED ORDER — IPRATROPIUM-ALBUTEROL 0.5-2.5 (3) MG/3ML IN SOLN
3.0000 mL | Freq: Once | RESPIRATORY_TRACT | Status: AC
  Administered 2014-08-11: 3 mL via RESPIRATORY_TRACT
  Filled 2014-08-11: qty 3

## 2014-08-11 NOTE — ED Provider Notes (Signed)
CSN: [id number]     Arrival date & time 08/11/14  1717 History   First MD Initiated Contact with Patient 08/11/14 1749     Chief Complaint  Patient presents with  . Asthma  . Chest Pain     (Consider location/radiation/quality/duration/timing/severity/associated sxs/prior Treatment) HPI  This is a 33 year old female who is currently [redacted] weeks pregnant. 2. History of asthma. She is previous hospitalization with intubation in 2002. Patient states that this week she developed URI symptoms including cough, sputum production, rhinorrhea. She is complaining paroxysms of cough followed by shortness of breath. She has been using her nebulizer every 2 hours for control of her symptoms. Patient states that today her symptoms were too much for her to bear and she sought treatment here. She denies wheezing, cough, fever, chills or myalgias. She denies any calf tenderness, hemoptysis, or sudden onset chest pain with shortness of breath. Patient does complain of chest pain with cough. The patient denies any abdominal pain or cramping.  Past Medical History  Diagnosis Date  . Asthma   . Preterm labor    Past Surgical History  Procedure Laterality Date  . Cesarean section    . Dilation and evacuation N/A 01/21/2014    Procedure: DILATATION AND EVACUATION;  Surgeon: Lesly DukesKelly H Leggett, MD;  Location: [facility];  Service: Gynecology;  Laterality: N/A;   Family History  Problem Relation Age of Onset  . Hypertension Mother    History  Substance Use Topics  . Smoking status: Never Smoker   . Smokeless tobacco: Not on file  . Alcohol Use: No   OB History    Gravida Para Term Preterm AB TAB SAB Ectopic Multiple Living   7 4 3 1 1 1  0   4     Review of Systems  Ten systems reviewed and are negative for acute change, except as noted in the HPI.    Allergies  Review of patient's allergies indicates no known allergies.  Home Medications   Prior to Admission medications   Medication Sig Start Date  End Date Taking? Authorizing Provider  acetaminophen (TYLENOL) 500 MG tablet Take 1,000 mg by mouth every 6 (six) hours as needed for mild pain.   Yes Historical Provider, MD  butalbital-acetaminophen-caffeine (FIORICET) 50-325-40 MG per tablet Take 1-2 tablets by mouth every 6 (six) hours as needed for headache. 01/25/14 01/25/15 Yes Fayrene HelperBowie Tran, PA-C  Prenatal Vit-Fe Fumarate-FA (PRENATAL MULTIVITAMIN) TABS tablet Take 1 tablet by mouth daily at 12 noon.   Yes Historical Provider, MD  cyclobenzaprine (FLEXERIL) 10 MG tablet Take 1 tablet (10 mg total) by mouth 2 (two) times daily as needed for muscle spasms. 07/13/14   Marny LowensteinJulie N Wenzel, PA-C   BP 127/60 mmHg  Pulse 108  Temp(Src) 98.7 F (37.1 C) (Oral)  Resp 19  SpO2 100% Physical Exam  Constitutional: She is oriented to person, place, and time. She appears well-developed and well-nourished. No distress.  HENT:  Head: Normocephalic and atraumatic.  Eyes: Conjunctivae are normal. No scleral icterus.  Neck: Normal range of motion.  Cardiovascular: Normal rate, regular rhythm and normal heart sounds.  Exam reveals no gallop and no friction rub.   No murmur heard. Pulmonary/Chest: Effort normal. No respiratory distress.  Tight, bronchitic cough. Patient has paroxysms of cough with shortness of breath. Air movement is diminished.  Abdominal: Soft. Bowel sounds are normal. She exhibits distension (Gravid abdomen). She exhibits no mass. There is no tenderness. There is no guarding.  Neurological: She is alert  and oriented to person, place, and time.  Skin: Skin is warm and dry. She is not diaphoretic.    ED Course  Procedures (including critical care time) Labs Review Labs Reviewed - No data to display  Imaging Review Dg Chest 2 View (if Patient Has Fever And/or Copd)  08/11/2014   CLINICAL DATA:  Cough and congestion. Chest pain. Twenty weeks pregnant. History of asthma.  EXAM: CHEST  2 VIEW  COMPARISON:  12/16/2012  FINDINGS: Midline  trachea.  Normal heart size and mediastinal contours.  Sharp costophrenic angles.  No pneumothorax.  Clear lungs.  Numerous leads and wires project over the chest.  IMPRESSION: No active cardiopulmonary disease.   Electronically Signed   By: Jeronimo GreavesKyle  Talbot M.D.   On: 08/11/2014 17:52     EKG Interpretation   Date/Time:  Wednesday August 11 2014 17:29:48 EST Ventricular Rate:  115 PR Interval:  151 QRS Duration: 79 QT Interval:  349 QTC Calculation: 483 R Axis:   13 Text Interpretation:  Sinus tachycardia Probable left atrial enlargement  Probable left ventricular hypertrophy Borderline T abnormalities, anterior  leads Borderline prolonged QT interval No significant change since last  tracing 15 Dec 2012 Confirmed by [facility]  MD-I, IVA ([id number]) on 08/11/2014  6:52:11 PM      MDM   Final diagnoses:  Asthma, mild persistent, with acute exacerbation  URI (upper respiratory infection)    Patient received 1 nebulizer treatment with DuoNeb. She had only minimal improvement in her symptoms.    Patient seen in shared visit with Dr. Lynelle DoctorKnapp. Given prednisone, 2nd duoneb treatment, Fetal heart tones 160.   Patient given 3rd treatment with good improvement. EKG with borderline prolonged Qt no signs of ischemia. cxr without acute abnormality.    Arthor CaptainAbigail Sandrika Schwinn, PA-C 08/16/14 1851  Ward GivensIva L Knapp, MD 08/17/14 [phone]

## 2014-08-11 NOTE — ED Notes (Addendum)
Pt has asthma. Pt having trouble breathing with cough that started last night but has gotten worse today. Pt c/o chest pain, and is [redacted] weeks pregnant.

## 2014-08-11 NOTE — Progress Notes (Signed)
  CARE MANAGEMENT ED NOTE 08/11/2014  [facility]   Account Number:  [id number]  Date Initiated:  08/11/2014  Documentation initiated by:  Radford PaxFERRERO,Joniyah Mallinger  Subjective/Objective Assessment:   Patient presents to Ed with chest pain shortness of breath and cough.     Subjective/Objective Assessment Detail:   Patient is [redacted] weeks pregnant.     Action/Plan:   Action/Plan Detail:   Anticipated DC Date:       Status Recommendation to Physician:   Result of Recommendation:    Other ED Services  Consult Working Plan    DC Planning Services  Other  PCP issues    Choice offered to / List presented to:            Status of service:  Completed, signed off  ED Comments:   ED Comments Detail:  EDCM spoke to patient and family member at bedside. Patient reports her pcp is Dr. Selena [facility]attenKim of [facility], but she has not seen Dr. Selena BattenKim.  Discussed importance of purpose and having a pcp.  Patient verbalized understanding.  System updated.  No futher EDCM needs at this time.

## 2014-08-11 NOTE — ED Provider Notes (Signed)
Pt is [redacted] weeks pregnant, hx of RAD reports shortness of breath with cough with yellow sputum production since yesterday evening. She does not know of fevers but states she's having chills. She reports she has had a normal pregnancy up to this point and is followed by Dr. Gaynell FaceMarshall.  Patient abdomen is consistent with dates.patient is noted to start having coughing spasms when she breathes deeply. She was examined after her first nebulizer and she still has diminished breath sounds in the breath sounds are coarse.  Medical screening examination/treatment/procedure(s) were conducted as a shared visit with non-physician practitioner(s) and myself.  I personally evaluated the patient during the encounter.   EKG Interpretation   Date/Time:  Wednesday August 11 2014 17:29:48 EST Ventricular Rate:  115 PR Interval:  151 QRS Duration: 79 QT Interval:  349 QTC Calculation: 483 R Axis:   13 Text Interpretation:  Sinus tachycardia Probable left atrial enlargement  Probable left ventricular hypertrophy Borderline T abnormalities, anterior  leads Borderline prolonged QT interval No significant change since last  tracing 15 Dec 2012 Confirmed by [facility]  MD-I, [facility] [place] ([id number]) on 08/11/2014  6:52:11 PM       Devoria AlbeIva Jayd Forrey, MD, Franz DellFACEP   Demetrie Borge L Aliyah Abeyta, MD 08/11/14 [phone]

## 2014-08-11 NOTE — ED Notes (Signed)
Verified w/ pharmacy that duoneb is appropriate in pregnancy. Spoke with [facility] Noble[facility]rin.

## 2014-08-11 NOTE — Discharge Instructions (Signed)
Bronchospasm °A bronchospasm is a spasm or tightening of the airways going into the lungs. During a bronchospasm breathing becomes more difficult because the airways get smaller. When this happens there can be coughing, a whistling sound when breathing (wheezing), and difficulty breathing. Bronchospasm is often associated with asthma, but not all patients who experience a bronchospasm have asthma. °CAUSES  °A bronchospasm is caused by inflammation or irritation of the airways. The inflammation or irritation may be triggered by:  °· Allergies (such as to animals, pollen, food, or mold). Allergens that cause bronchospasm may cause wheezing immediately after exposure or many hours later.   °· Infection. Viral infections are believed to be the most common cause of bronchospasm.   °· Exercise.   °· Irritants (such as pollution, cigarette smoke, strong odors, aerosol sprays, and paint fumes).   °· Weather changes. Winds increase molds and pollens in the air. Rain refreshes the air by washing irritants out. Cold air may cause inflammation.   °· Stress and emotional upset.   °SIGNS AND SYMPTOMS  °· Wheezing.   °· Excessive nighttime coughing.   °· Frequent or severe coughing with a simple cold.   °· Chest tightness.   °· Shortness of breath.   °DIAGNOSIS  °Bronchospasm is usually diagnosed through a history and physical exam. Tests, such as chest X-rays, are sometimes done to look for other conditions. °TREATMENT  °· Inhaled medicines can be given to open up your airways and help you breathe. The medicines can be given using either an inhaler or a nebulizer machine. °· Corticosteroid medicines may be given for severe bronchospasm, usually when it is associated with asthma. °HOME CARE INSTRUCTIONS  °· Always have a plan prepared for seeking medical care. Know when to call your health care provider and local emergency services (911 in the [place] Know where you can access local emergency care. °· Only take medicines as  directed by your health care provider. °· If you were prescribed an inhaler or nebulizer machine, ask your health care provider to explain how to use it correctly. Always use a spacer with your inhaler if you were given one. °· It is necessary to remain calm during an attack. Try to relax and breathe more slowly.  °· Control your home environment in the following ways:   °¨ Change your heating and air conditioning filter at least once a month.   °¨ Limit your use of fireplaces and wood stoves. °¨ Do not smoke and do not allow smoking in your home.   °¨ Avoid exposure to perfumes and fragrances.   °¨ Get rid of pests (such as roaches and mice) and their droppings.   °¨ Throw away plants if you see mold on them.   °¨ Keep your house clean and dust free.   °¨ Replace carpet with wood, tile, or vinyl flooring. Carpet can trap dander and dust.   °¨ Use allergy-proof pillows, mattress covers, and box spring covers.   °¨ Wash bed sheets and blankets every week in hot water and dry them in a dryer.   °¨ Use blankets that are made of polyester or cotton.   °¨ Wash hands frequently. °SEEK MEDICAL CARE IF:  °· You have muscle aches.   °· You have chest pain.   °· The sputum changes from clear or white to yellow, green, gray, or bloody.   °· The sputum you cough up gets thicker.   °· There are problems that may be related to the medicine you are given, such as a rash, itching, swelling, or trouble breathing.   °SEEK IMMEDIATE MEDICAL CARE IF:  °· You have worsening wheezing and coughing even   after taking your prescribed medicines.   °· You have increased difficulty breathing.   °· You develop severe chest pain. °MAKE SURE YOU:  °· Understand these instructions. °· Will watch your condition. °· Will get help right away if you are not doing well or get worse. °Document Released: 09/13/2003 Document Revised: 09/15/2013 Document Reviewed: 03/02/2013 °ExitCare® Patient Information ©2015 [employer]. This information is not  intended to replace advice given to you by your health care provider. Make sure you discuss any questions you have with your health care provider. ° °

## 2014-08-11 NOTE — ED Notes (Signed)
Patient transported to X-ray 

## 2014-09-24 NOTE — L&D Delivery Note (Signed)
Delivery Note At 1:13 PM a viable female was delivered via  (Presentation: ;  ).  APGAR: , ; weight  .   Placenta status: , .  Cord:  with the following complications: .  Cord pH: not done  Anesthesia: Epidural  Episiotomy:   Lacerations:   Suture Repair: 2.0 Est. Blood Loss (mL):    Mom to postpartum.  Baby to Couplet care / Skin to Skin.  MARSHALL,BERNARD A 11/14/2014, 1:21 PM

## 2014-09-29 ENCOUNTER — Inpatient Hospital Stay (HOSPITAL_COMMUNITY)
Admission: AD | Admit: 2014-09-29 | Discharge: 2014-09-30 | Disposition: A | Discharge status: Home / Self Care | Payer: BLUE CROSS/BLUE SHIELD | Source: Ambulatory Visit | Attending: Obstetrics | Admitting: Obstetrics

## 2014-09-29 ENCOUNTER — Encounter (HOSPITAL_COMMUNITY): Payer: Self-pay | Admitting: *Deleted

## 2014-09-29 DIAGNOSIS — R109 Unspecified abdominal pain: Secondary | ICD-10-CM | POA: Insufficient documentation

## 2014-09-29 DIAGNOSIS — R0781 Pleurodynia: Secondary | ICD-10-CM | POA: Diagnosis not present

## 2014-09-29 DIAGNOSIS — O9989 Other specified diseases and conditions complicating pregnancy, childbirth and the puerperium: Secondary | ICD-10-CM | POA: Diagnosis not present

## 2014-09-29 DIAGNOSIS — R079 Chest pain, unspecified: Secondary | ICD-10-CM

## 2014-09-29 DIAGNOSIS — Z3A27 27 weeks gestation of pregnancy: Secondary | ICD-10-CM | POA: Diagnosis not present

## 2014-09-29 LAB — URINE MICROSCOPIC-ADD ON

## 2014-09-29 LAB — URINALYSIS, ROUTINE W REFLEX MICROSCOPIC
BILIRUBIN URINE: NEGATIVE
Glucose, UA: 100 mg/dL — AB
Ketones, ur: NEGATIVE mg/dL
Leukocytes, UA: NEGATIVE
Nitrite: NEGATIVE
PH: 7 (ref 5.0–8.0)
PROTEIN: NEGATIVE mg/dL
SPECIFIC GRAVITY, URINE: 1.015 (ref 1.005–1.030)
UROBILINOGEN UA: 0.2 mg/dL (ref 0.0–1.0)

## 2014-09-29 MED ORDER — OXYCODONE-ACETAMINOPHEN 5-325 MG PO TABS
1.0000 | ORAL_TABLET | Freq: Once | ORAL | Status: AC
  Administered 2014-09-30: 1 via ORAL
  Filled 2014-09-29: qty 1

## 2014-09-29 MED ORDER — PROMETHAZINE HCL 25 MG PO TABS
25.0000 mg | ORAL_TABLET | Freq: Once | ORAL | Status: AC
  Administered 2014-09-30: 25 mg via ORAL
  Filled 2014-09-29: qty 1

## 2014-09-29 NOTE — Progress Notes (Signed)
Pt states she has had a headache. Pt states she has pills from Dr. Gaynell FaceMarshall but has not taken them today Pt states she has been taking too many

## 2014-09-29 NOTE — MAU Note (Signed)
Pt reports she has been having upper left abd pain x one week, worsening. Denies nausea, vomiting , diarrhea, or fever. Denies dysuria, denies bleeding.

## 2014-09-29 NOTE — MAU Note (Signed)
Pt states she has pain on her left side that she has had there for 1 wk.Pt states she has been treating it with heating pad and icy hot

## 2014-09-29 NOTE — MAU Note (Signed)
Pt states pain started getting worse on Saturday . Pt states the heating pad was making it feel better but now nothing is working

## 2014-09-30 ENCOUNTER — Inpatient Hospital Stay (HOSPITAL_COMMUNITY): Payer: BLUE CROSS/BLUE SHIELD

## 2014-09-30 LAB — CBC
HEMATOCRIT: 29.7 % — AB (ref 36.0–46.0)
Hemoglobin: 9.7 g/dL — ABNORMAL LOW (ref 12.0–15.0)
MCH: 26.5 pg (ref 26.0–34.0)
MCHC: 32.7 g/dL (ref 30.0–36.0)
MCV: 81.1 fL (ref 78.0–100.0)
Platelets: 223 10*3/uL (ref 150–400)
RBC: 3.66 MIL/uL — ABNORMAL LOW (ref 3.87–5.11)
RDW: 16 % — AB (ref 11.5–15.5)
WBC: 8.2 10*3/uL (ref 4.0–10.5)

## 2014-09-30 MED ORDER — PROMETHAZINE HCL 25 MG PO TABS: 25.0000 mg | ORAL_TABLET | Freq: Four times a day (QID) | ORAL | Status: DC | PRN

## 2014-09-30 MED ORDER — OXYCODONE-ACETAMINOPHEN 5-325 MG PO TABS: 2.0000 | ORAL_TABLET | ORAL | Status: DC | PRN

## 2014-09-30 NOTE — MAU Provider Note (Signed)
History     CSN: [id number]  Arrival date and time: 09/29/14 2150   First Provider Initiated Contact with Patient 09/29/14 2340      Chief Complaint  Patient presents with  . Abdominal Pain   HPI Comments: Katherine Mcguire 34 y.o. [id number] [redacted]w[redacted]d presents to MAU with left rib pain that has been ongoing for one week. She has been taking tylenol, motrin, ice, heat with no relief. She was seen for similar pain in November. She admits she is very stressed with financial issues at this time.   Abdominal Pain      Past Medical History  Diagnosis Date  . Asthma   . Preterm labor     Past Surgical History  Procedure Laterality Date  . Cesarean section    . Dilation and evacuation N/A 01/21/2014    Procedure: DILATATION AND EVACUATION;  Surgeon: Lesly Dukes, MD;  Location: [facility];  Service: Gynecology;  Laterality: N/A;    Family History  Problem Relation Age of Onset  . Hypertension Mother     History  Substance Use Topics  . Smoking status: Never Smoker   . Smokeless tobacco: Not on file  . Alcohol Use: No    Allergies: No Known Allergies  Prescriptions prior to admission  Medication Sig Dispense Refill Last Dose  . acetaminophen (TYLENOL) 500 MG tablet Take 1,000 mg by mouth every 6 (six) hours as needed for mild pain.   Past Month at Unknown time  . butalbital-acetaminophen-caffeine (FIORICET) 50-325-40 MG per tablet Take 1-2 tablets by mouth every 6 (six) hours as needed for headache. 20 tablet 0 08/08/2014  . cyclobenzaprine (FLEXERIL) 10 MG tablet Take 1 tablet (10 mg total) by mouth 2 (two) times daily as needed for muscle spasms. 20 tablet 0 Completed Course at Unknown time  . predniSONE (DELTASONE) 20 MG tablet Take 2 tablets (40 mg total) by mouth daily. 10 tablet 0   . Prenatal Vit-Fe Fumarate-FA (PRENATAL MULTIVITAMIN) TABS tablet Take 1 tablet by mouth daily at 12 noon.   08/08/2014    Review of Systems  Constitutional: Negative.   HENT: Negative.    Respiratory: Negative.   Cardiovascular: Negative.   Gastrointestinal: Positive for abdominal pain.  Genitourinary: Negative.   Musculoskeletal: Negative.   Skin: Negative.   Neurological: Negative.   Psychiatric/Behavioral: Negative.    Physical Exam   Blood pressure 101/61, pulse 115, temperature 97.6 F (36.4 C), temperature source Oral, resp. rate 20, height  (1.651 m), weight 75.751 kg (167 lb), SpO2 100 %, unknown if currently breastfeeding.  Physical Exam  Constitutional: She is oriented to person, place, and time. She appears well-developed and well-nourished. No distress.  HENT:  Head: Normocephalic and atraumatic.  Eyes: Pupils are equal, round, and reactive to light.  Cardiovascular: Normal rate, regular rhythm and normal heart sounds.   Respiratory: Effort normal and breath sounds normal.  GI: Soft. Bowel sounds are normal. She exhibits no distension. There is no tenderness. There is no rebound.  Musculoskeletal: Normal range of motion.  No point tenderness where she is pointing in left lower rib cage  Neurological: She is alert and oriented to person, place, and time.  Skin: Skin is warm and dry.  Psychiatric: She has a normal mood and affect. Her behavior is normal. Judgment and thought content normal.   TOCO: rate 150/ no contractions  Results for orders placed or performed during the hospital encounter of 09/29/14 (from the past 24 hour(s))  Urinalysis, Routine w  reflex microscopic     Status: Abnormal   Collection Time: 09/29/14 10:24 PM  Result Value Ref Range   Color, Urine YELLOW YELLOW   APPearance CLEAR CLEAR   Specific Gravity, Urine 1.015 1.005 - 1.030   pH 7.0 5.0 - 8.0   Glucose, UA 100 (A) NEGATIVE mg/dL   Hgb urine dipstick TRACE (A) NEGATIVE   Bilirubin Urine NEGATIVE NEGATIVE   Ketones, ur NEGATIVE NEGATIVE mg/dL   Protein, ur NEGATIVE NEGATIVE mg/dL   Urobilinogen, UA 0.2 0.0 - 1.0 mg/dL   Nitrite NEGATIVE NEGATIVE   Leukocytes, UA  NEGATIVE NEGATIVE  Urine microscopic-add on     Status: Abnormal   Collection Time: 09/29/14 10:24 PM  Result Value Ref Range   Squamous Epithelial / LPF RARE RARE   WBC, UA 0-2 <3 WBC/hpf   RBC / HPF 0-2 <3 RBC/hpf   Bacteria, UA FEW (A) RARE  CBC     Status: Abnormal   Collection Time: 09/30/14 12:05 AM  Result Value Ref Range   WBC 8.2 4.0 - 10.5 K/uL   RBC 3.66 (L) 3.87 - 5.11 MIL/uL   Hemoglobin 9.7 (L) 12.0 - 15.0 g/dL   HCT 40.929.7 (L) 81.136.0 - 91.446.0 %   MCV 81.1 78.0 - 100.0 fL   MCH 26.5 26.0 - 34.0 pg   MCHC 32.7 30.0 - 36.0 g/dL   RDW 78.216.0 (H) 95.611.5 - 21.315.5 %   Platelets 223 150 - 400 K/uL   Dg Chest 2 View  09/30/2014   CLINICAL DATA:  Twenty-seven weeks pregnant. Double shielded. History of cough and cold with pain under the left breast. No injury. Nonsmoker. Pain in rib.  EXAM: CHEST  2 VIEW  COMPARISON:  08/11/2014  FINDINGS: The heart size and mediastinal contours are within normal limits. Both lungs are clear. The visualized skeletal structures are unremarkable.  IMPRESSION: No active cardiopulmonary disease.   Electronically Signed   By: Burman NievesWilliam  Stevens M.D.   On: 09/30/2014 01:15     MAU Course  Procedures  MDM   Assessment and Plan   A: Left rib pain  P: May be related to position of baby Advised to call Dr Gaynell FaceMarshall  Few percocet/ phenergan  Return to MAU as needed  Carolynn ServeBarefoot, Katherine Mcguire 09/30/2014, 1:28 AM

## 2014-09-30 NOTE — Discharge Instructions (Signed)
Chest Wall Pain °Chest wall pain is pain in or around the bones and muscles of your chest. It may take up to 6 weeks to get better. It may take longer if you must stay physically active in your work and activities.  °CAUSES  °Chest wall pain may happen on its own. However, it may be caused by: °· A viral illness like the flu. °· Injury. °· Coughing. °· Exercise. °· Arthritis. °· Fibromyalgia. °· Shingles. °HOME CARE INSTRUCTIONS  °· Avoid overtiring physical activity. Try not to strain or perform activities that cause pain. This includes any activities using your chest or your abdominal and side muscles, especially if heavy weights are used. °· Put ice on the sore area. °· Put ice in a plastic bag. °· Place a towel between your skin and the bag. °· Leave the ice on for 15-20 minutes per hour while awake for the first 2 days. °· Only take over-the-counter or prescription medicines for pain, discomfort, or fever as directed by your caregiver. °SEEK IMMEDIATE MEDICAL CARE IF:  °· Your pain increases, or you are very uncomfortable. °· You have a fever. °· Your chest pain becomes worse. °· You have new, unexplained symptoms. °· You have nausea or vomiting. °· You feel sweaty or lightheaded. °· You have a cough with phlegm (sputum), or you cough up blood. °MAKE SURE YOU:  °· Understand these instructions. °· Will watch your condition. °· Will get help right away if you are not doing well or get worse. °Document Released: 09/10/2005 Document Revised: 12/03/2011 Document Reviewed: 05/07/2011 °ExitCare® Patient Information ©2015 [employer]. This information is not intended to replace advice given to you by your health care provider. Make sure you discuss any questions you have with your health care provider. ° °Chest Pain (Nonspecific) °It is often hard to give a diagnosis for the cause of chest pain. There is always a chance that your pain could be related to something serious, such as a heart attack or a blood clot in  the lungs. You need to follow up with your doctor. °HOME CARE °· If antibiotic medicine was given, take it as directed by your doctor. Finish the medicine even if you start to feel better. °· For the next few days, avoid activities that bring on chest pain. Continue physical activities as told by your doctor. °· Do not use any tobacco products. This includes cigarettes, chewing tobacco, and e-cigarettes. °· Avoid drinking alcohol. °· Only take medicine as told by your doctor. °· Follow your doctor's suggestions for more testing if your chest pain does not go away. °· Keep all doctor visits you made. °GET HELP IF: °· Your chest pain does not go away, even after treatment. °· You have a rash with blisters on your chest. °· You have a fever. °GET HELP RIGHT AWAY IF:  °· You have more pain or pain that spreads to your arm, neck, jaw, back, or belly (abdomen). °· You have shortness of breath. °· You cough more than usual or cough up blood. °· You have very bad back or belly pain. °· You feel sick to your stomach (nauseous) or throw up (vomit). °· You have very bad weakness. °· You pass out (faint). °· You have chills. °This is an emergency. Do not wait to see if the problems will go away. Call your local emergency services (911 in [place] Do not drive yourself to the hospital. °MAKE SURE YOU:  °· Understand these instructions. °· Will watch your   condition. °· Will get help right away if you are not doing well or get worse. °Document Released: 02/27/2008 Document Revised: 09/15/2013 Document Reviewed: 02/27/2008 °ExitCare® Patient Information ©2015 [employer]. This information is not intended to replace advice given to you by your health care provider. Make sure you discuss any questions you have with your health care provider. ° °

## 2014-11-06 ENCOUNTER — Inpatient Hospital Stay (HOSPITAL_COMMUNITY)
Admission: AD | Admit: 2014-11-06 | Discharge: 2014-11-08 | DRG: 781 | Disposition: A | Discharge status: Home / Self Care | Payer: BLUE CROSS/BLUE SHIELD | Source: Ambulatory Visit | Attending: Obstetrics | Admitting: Obstetrics

## 2014-11-06 ENCOUNTER — Encounter (HOSPITAL_COMMUNITY): Payer: Self-pay | Admitting: *Deleted

## 2014-11-06 DIAGNOSIS — N858 Other specified noninflammatory disorders of uterus: Secondary | ICD-10-CM | POA: Diagnosis not present

## 2014-11-06 DIAGNOSIS — O368130 Decreased fetal movements, third trimester, not applicable or unspecified: Secondary | ICD-10-CM | POA: Diagnosis not present

## 2014-11-06 DIAGNOSIS — O9A219 Injury, poisoning and certain other consequences of external causes complicating pregnancy, unspecified trimester: Secondary | ICD-10-CM | POA: Insufficient documentation

## 2014-11-06 DIAGNOSIS — Z3A32 32 weeks gestation of pregnancy: Secondary | ICD-10-CM | POA: Diagnosis present

## 2014-11-06 DIAGNOSIS — Y92009 Unspecified place in unspecified non-institutional (private) residence as the place of occurrence of the external cause: Secondary | ICD-10-CM

## 2014-11-06 DIAGNOSIS — W19XXXA Unspecified fall, initial encounter: Secondary | ICD-10-CM

## 2014-11-06 DIAGNOSIS — W010XXA Fall on same level from slipping, tripping and stumbling without subsequent striking against object, initial encounter: Secondary | ICD-10-CM | POA: Diagnosis present

## 2014-11-06 DIAGNOSIS — O9A213 Injury, poisoning and certain other consequences of external causes complicating pregnancy, third trimester: Secondary | ICD-10-CM | POA: Diagnosis present

## 2014-11-06 DIAGNOSIS — T799XXA Unspecified early complication of trauma, initial encounter: Secondary | ICD-10-CM | POA: Diagnosis present

## 2014-11-06 LAB — URINE MICROSCOPIC-ADD ON

## 2014-11-06 LAB — URINALYSIS, ROUTINE W REFLEX MICROSCOPIC
Bilirubin Urine: NEGATIVE
Glucose, UA: NEGATIVE mg/dL
HGB URINE DIPSTICK: NEGATIVE
KETONES UR: NEGATIVE mg/dL
NITRITE: NEGATIVE
Protein, ur: NEGATIVE mg/dL
SPECIFIC GRAVITY, URINE: 1.01 (ref 1.005–1.030)
Urobilinogen, UA: 0.2 mg/dL (ref 0.0–1.0)
pH: 7 (ref 5.0–8.0)

## 2014-11-06 MED ORDER — CYCLOBENZAPRINE HCL 10 MG PO TABS
10.0000 mg | ORAL_TABLET | Freq: Once | ORAL | Status: AC
  Administered 2014-11-06: 10 mg via ORAL
  Filled 2014-11-06: qty 1

## 2014-11-06 NOTE — Progress Notes (Signed)
Julie Wenzel PA in to see pt 

## 2014-11-06 NOTE — MAU Note (Signed)
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 Fell on my back about 1000. Having some back pain. Baby has only moved twice today. Denies bleeding or LOF

## 2014-11-06 NOTE — Progress Notes (Signed)
Baby with hiccoughs 

## 2014-11-06 NOTE — MAU Provider Note (Signed)
History     CSN: [id number]  Arrival date and time: 11/06/14 2249   First Provider Initiated Contact with Patient 11/06/14 2334      Chief Complaint  Patient presents with  . Decreased Fetal Movement  . Fall   HPI  [name] is a 34 y.o. [id number] at [id number] who presents to MAU today with complaint of a fall at home and decreased fetal movement. The patient states that she slipped and fell onto her back/buttocks around 10:00 am today. She did hit her head, but denies headache or LOC. She states some moderate low back pain. She states that she took Tylenol with some relief. She states decreased fetal movement since the fall. She denies vaginal bleeding or LOF. She has noted improved movement since arrival in MAU.   OB History    Gravida Para Term Preterm AB TAB SAB Ectopic Multiple Living   6 4 3 1 1 1  0   4      Past Medical History  Diagnosis Date  . Asthma   . Preterm labor     Past Surgical History  Procedure Laterality Date  . Cesarean section    . Dilation and evacuation N/A 01/21/2014    Procedure: DILATATION AND EVACUATION;  Surgeon: Lesly DukesKelly H Leggett, MD;  Location: [facility];  Service: Gynecology;  Laterality: N/A;    Family History  Problem Relation Age of Onset  . Hypertension Mother     History  Substance Use Topics  . Smoking status: Never Smoker   . Smokeless tobacco: Not on file  . Alcohol Use: No    Allergies: No Known Allergies  Prescriptions prior to admission  Medication Sig Dispense Refill Last Dose  . acetaminophen (TYLENOL) 500 MG tablet Take 1,000 mg by mouth every 6 (six) hours as needed for mild pain.   11/06/2014 at 1015  . Prenatal Vit-Fe Fumarate-FA (PRENATAL MULTIVITAMIN) TABS tablet Take 1 tablet by mouth daily at 12 noon.   11/06/2014 at Unknown time  . promethazine (PHENERGAN) 25 MG tablet Take 1 tablet (25 mg total) by mouth every 6 (six) hours as needed for nausea or vomiting. 30 tablet 0 Past Month at Unknown time  .  butalbital-acetaminophen-caffeine (FIORICET) 50-325-40 MG per tablet Take 1-2 tablets by mouth every 6 (six) hours as needed for headache. 20 tablet 0 08/08/2014  . cyclobenzaprine (FLEXERIL) 10 MG tablet Take 1 tablet (10 mg total) by mouth 2 (two) times daily as needed for muscle spasms. 20 tablet 0 Completed Course at Unknown time  . oxyCODONE-acetaminophen (PERCOCET/ROXICET) 5-325 MG per tablet Take 2 tablets by mouth every 4 (four) hours as needed for moderate pain or severe pain. 6 tablet 0   . predniSONE (DELTASONE) 20 MG tablet Take 2 tablets (40 mg total) by mouth daily. 10 tablet 0     Review of Systems  Constitutional: Negative for fever and malaise/fatigue.  Gastrointestinal: Negative for abdominal pain.  Genitourinary:       Neg - vaginal bleeding, discharge, LOF  Musculoskeletal: Positive for back pain.  Neurological: Negative for dizziness, loss of consciousness and headaches.   Physical Exam   Blood pressure 123/64, pulse 108, temperature 98 F (36.7 C), resp. rate 18, height 5\' 5"  (1.651 m), weight 169 lb 3.2 oz (76.749 kg), SpO2 100 %, unknown if currently breastfeeding.  Physical Exam  Constitutional: She is oriented to person, place, and time. She appears well-developed and well-nourished. No distress.  HENT:  Head: Normocephalic.  Eyes: Conjunctivae and  EOM are normal.  Cardiovascular: Normal rate.   Respiratory: Effort normal.  GI: Soft. She exhibits no distension and no mass. There is no tenderness. There is no rebound and no guarding.  Neurological: She is alert and oriented to person, place, and time.  Skin: Skin is warm and dry. No erythema.  Psychiatric: She has a normal mood and affect.   Results for orders placed or performed during the hospital encounter of 11/06/14 (from the past 24 hour(s))  Urinalysis, Routine w reflex microscopic     Status: Abnormal   Collection Time: 11/06/14 11:05 PM  Result Value Ref Range   Color, Urine YELLOW YELLOW    APPearance CLEAR CLEAR   Specific Gravity, Urine 1.010 1.005 - 1.030   pH 7.0 5.0 - 8.0   Glucose, UA NEGATIVE NEGATIVE mg/dL   Hgb urine dipstick NEGATIVE NEGATIVE   Bilirubin Urine NEGATIVE NEGATIVE   Ketones, ur NEGATIVE NEGATIVE mg/dL   Protein, ur NEGATIVE NEGATIVE mg/dL   Urobilinogen, UA 0.2 0.0 - 1.0 mg/dL   Nitrite NEGATIVE NEGATIVE   Leukocytes, UA TRACE (A) NEGATIVE  Urine microscopic-add on     Status: Abnormal   Collection Time: 11/06/14 11:05 PM  Result Value Ref Range   Squamous Epithelial / LPF FEW (A) RARE   WBC, UA 3-6 <3 WBC/hpf   RBC / HPF 0-2 <3 RBC/hpf   Bacteria, UA FEW (A) RARE   Fetal Monitoring: Baseline: 150 bpm, moderate variability, + accelerations, no decelerations Contraction: few MAU Course  Procedures None  MDM UA today Flexeril given in MAU Discussed patient with Dr. Clearance Coots. Admit for observation to Antenatal with routine orders. Possible [place] in the morning.   Assessment and Plan  A: SIUP at [redacted]w[redacted]d Fall at home Back pain in pregnancy  P: Admit to antenatal for observation overnight  Marny Lowenstein, PA-C  11/06/2014, 11:34 PM

## 2014-11-07 ENCOUNTER — Observation Stay (HOSPITAL_COMMUNITY): Payer: BLUE CROSS/BLUE SHIELD

## 2014-11-07 DIAGNOSIS — Y92009 Unspecified place in unspecified non-institutional (private) residence as the place of occurrence of the external cause: Secondary | ICD-10-CM

## 2014-11-07 DIAGNOSIS — N858 Other specified noninflammatory disorders of uterus: Secondary | ICD-10-CM | POA: Diagnosis present

## 2014-11-07 DIAGNOSIS — O9A213 Injury, poisoning and certain other consequences of external causes complicating pregnancy, third trimester: Secondary | ICD-10-CM | POA: Diagnosis present

## 2014-11-07 DIAGNOSIS — W19XXXA Unspecified fall, initial encounter: Secondary | ICD-10-CM

## 2014-11-07 DIAGNOSIS — W010XXA Fall on same level from slipping, tripping and stumbling without subsequent striking against object, initial encounter: Secondary | ICD-10-CM | POA: Diagnosis present

## 2014-11-07 DIAGNOSIS — O368130 Decreased fetal movements, third trimester, not applicable or unspecified: Secondary | ICD-10-CM | POA: Diagnosis present

## 2014-11-07 DIAGNOSIS — Z3A32 32 weeks gestation of pregnancy: Secondary | ICD-10-CM | POA: Diagnosis present

## 2014-11-07 LAB — CBC
HCT: 33.2 % — ABNORMAL LOW (ref 36.0–46.0)
Hemoglobin: 10.8 g/dL — ABNORMAL LOW (ref 12.0–15.0)
MCH: 27 pg (ref 26.0–34.0)
MCHC: 32.5 g/dL (ref 30.0–36.0)
MCV: 83 fL (ref 78.0–100.0)
Platelets: 190 10*3/uL (ref 150–400)
RBC: 4 MIL/uL (ref 3.87–5.11)
RDW: 20 % — ABNORMAL HIGH (ref 11.5–15.5)
WBC: 7.3 10*3/uL (ref 4.0–10.5)

## 2014-11-07 LAB — TYPE AND SCREEN
ABO/RH(D): B POS
ANTIBODY SCREEN: NEGATIVE

## 2014-11-07 MED ORDER — CALCIUM CARBONATE ANTACID 500 MG PO CHEW: 2.0000 | CHEWABLE_TABLET | ORAL | Status: DC | PRN

## 2014-11-07 MED ORDER — NIFEDIPINE ER 30 MG PO TB24
30.0000 mg | ORAL_TABLET | Freq: Two times a day (BID) | ORAL | Status: DC
  Administered 2014-11-07 – 2014-11-08 (×3): 30 mg via ORAL
  Filled 2014-11-07 (×4): qty 1

## 2014-11-07 MED ORDER — ACETAMINOPHEN 325 MG PO TABS
650.0000 mg | ORAL_TABLET | ORAL | Status: DC | PRN
  Administered 2014-11-07 (×2): 650 mg via ORAL
  Filled 2014-11-07 (×2): qty 2

## 2014-11-07 MED ORDER — LACTATED RINGERS IV BOLUS (SEPSIS)
1000.0000 mL | Freq: Once | INTRAVENOUS | Status: AC
  Administered 2014-11-07: 1000 mL via INTRAVENOUS

## 2014-11-07 MED ORDER — PRENATAL MULTIVITAMIN CH
1.0000 | ORAL_TABLET | Freq: Every day | ORAL | Status: DC
  Administered 2014-11-07: 1 via ORAL
  Filled 2014-11-07: qty 1

## 2014-11-07 MED ORDER — DOCUSATE SODIUM 100 MG PO CAPS
100.0000 mg | ORAL_CAPSULE | Freq: Every day | ORAL | Status: DC
  Filled 2014-11-07: qty 1

## 2014-11-07 MED ORDER — ZOLPIDEM TARTRATE 5 MG PO TABS
5.0000 mg | ORAL_TABLET | Freq: Every evening | ORAL | Status: DC | PRN
  Administered 2014-11-07 (×2): 5 mg via ORAL
  Filled 2014-11-07 (×2): qty 1

## 2014-11-07 MED ORDER — LACTATED RINGERS IV SOLN
INTRAVENOUS | Status: DC
  Administered 2014-11-07 (×2): via INTRAVENOUS

## 2014-11-07 NOTE — H&P (Signed)
Katherine Mcguire is a 34 y.o. female presenting for decreased FM after fall at home.  No vaginal bleeding.  Has backache but no contractions. Maternal Medical History:  Fetal activity: Perceived fetal activity is decreased.    Prenatal complications: no prenatal complications Prenatal Complications - Diabetes: none.    OB History    Gravida Para Term Preterm AB TAB SAB Ectopic Multiple Living   6 4 3 1 1 1  0   4     Past Medical History  Diagnosis Date  . Asthma   . Preterm labor    Past Surgical History  Procedure Laterality Date  . Cesarean section    . Dilation and evacuation N/A 01/21/2014    Procedure: DILATATION AND EVACUATION;  Surgeon: Lesly DukesKelly H Leggett, MD;  Location: [facility];  Service: Gynecology;  Laterality: N/A;   Family History: family history includes Hypertension in her mother. Social History:  reports that she has never smoked. She does not have any smokeless tobacco history on file. She reports that she does not drink alcohol or use illicit drugs.   Prenatal Transfer Tool  Maternal Diabetes: No Genetic Screening: Declined Maternal Ultrasounds/Referrals: Normal Fetal Ultrasounds or other Referrals:  None Maternal Substance Abuse:  No Significant Maternal Medications:  None Significant Maternal Lab Results:  None Other Comments:  None  Review of Systems  Musculoskeletal: Positive for back pain.  All other systems reviewed and are negative.     Blood pressure 123/73, pulse 116, temperature 97.9 F (36.6 C), temperature source Oral, resp. rate 20, height 5\' 5"  (1.651 m), weight 169 lb 3.2 oz (76.749 kg), SpO2 100 %, unknown if currently breastfeeding. Maternal Exam:  Abdomen: Patient reports no abdominal tenderness.   Physical Exam  Nursing note and vitals reviewed. Constitutional: She is oriented to person, place, and time. She appears well-developed and well-nourished.  HENT:  Head: Normocephalic and atraumatic.  Eyes: Conjunctivae are normal. Pupils  are equal, round, and reactive to light.  Neck: Normal range of motion. Neck supple.  Cardiovascular: Normal rate and regular rhythm.   Respiratory: Effort normal and breath sounds normal.  GI: Soft.  Neurological: She is alert and oriented to person, place, and time.  Skin: Skin is warm and dry.  Psychiatric: She has a normal mood and affect. Her behavior is normal. Judgment and thought content normal.    Prenatal labs: ABO, Rh: --/--/B POS, B POS (04/30 [id number]) Antibody: NEG (04/30 0905) Rubella:   RPR:    HBsAg:    HIV:    GBS:     Assessment/Plan: 32 weeks.  Decreased FM after a fall.  Backache.  Admit for observation.  Ultrasound in AM.  HARPER,CHARLES A 11/07/2014, 5:13 AM

## 2014-11-07 NOTE — Plan of Care (Signed)
Problem: Consults Goal: Birthing Suites Patient Information Press F2 to bring up selections list Outcome: Completed/Met Date Met:  11/07/14  Antenatal Patient (< 37 weeks)

## 2014-11-07 NOTE — Progress Notes (Signed)
UR completed 

## 2014-11-07 NOTE — Progress Notes (Signed)
[facility] Pt may come to [phone]

## 2014-11-08 DIAGNOSIS — T799XXA Unspecified early complication of trauma, initial encounter: Secondary | ICD-10-CM | POA: Diagnosis present

## 2014-11-08 DIAGNOSIS — O9A219 Injury, poisoning and certain other consequences of external causes complicating pregnancy, unspecified trimester: Secondary | ICD-10-CM | POA: Insufficient documentation

## 2014-11-08 DIAGNOSIS — Z3A32 32 weeks gestation of pregnancy: Secondary | ICD-10-CM | POA: Insufficient documentation

## 2014-11-08 MED ORDER — NIFEDIPINE ER 30 MG PO TB24: 30.0000 mg | ORAL_TABLET | Freq: Two times a day (BID) | ORAL | Status: DC

## 2014-11-08 NOTE — Progress Notes (Signed)
Patient ID: Katherine Mcguire, female   DOB: 1981/04/13, 34 y.o.   MRN: [id number] Vital signs normal she has occasional contractions reactive tracing her contractions have been controlled on Procardia 30 XL twice a day patient discharged home to see me in one week

## 2014-11-08 NOTE — Progress Notes (Signed)
Patient discharge instructions given.  Educated about use of Procardia at home and follow up appointment with dr Gaynell Facemarshall on 11/17/14 at 1030 am.  Informed about when to seek medical help with leaking of fluid or blood, decreased fetal movement.  Patient verbalized understanding.  Patient to be transported home by husband via personal car.

## 2014-11-08 NOTE — Plan of Care (Signed)
Problem: Phase I Progression Outcomes Goal: Contractions < 5-6/hour Outcome: Progressing Patient contracting irregularly throughout the night with contractions spaced 3-5 minutes at peak.  Contractions have spaced and uterine irregularity noted now.

## 2014-11-08 NOTE — Discharge Summary (Signed)
  Patient states that she fell on her back after polishing floors at home and then she was admitted here with irregular contractions she has occasional contractions at this time but she is on Procardia 30 XL twice a day and discharged home today to see me in one week

## 2014-11-14 ENCOUNTER — Inpatient Hospital Stay (HOSPITAL_COMMUNITY)
Admission: AD | Admit: 2014-11-14 | Discharge: 2014-11-16 | DRG: 774 | Disposition: A | Discharge status: Home / Self Care | Payer: BLUE CROSS/BLUE SHIELD | Source: Ambulatory Visit | Attending: Obstetrics | Admitting: Obstetrics

## 2014-11-14 ENCOUNTER — Inpatient Hospital Stay (HOSPITAL_COMMUNITY): Payer: BLUE CROSS/BLUE SHIELD | Admitting: Anesthesiology

## 2014-11-14 ENCOUNTER — Inpatient Hospital Stay (HOSPITAL_COMMUNITY): Payer: BLUE CROSS/BLUE SHIELD

## 2014-11-14 ENCOUNTER — Encounter (HOSPITAL_COMMUNITY): Payer: Self-pay | Admitting: *Deleted

## 2014-11-14 DIAGNOSIS — Z3A33 33 weeks gestation of pregnancy: Secondary | ICD-10-CM

## 2014-11-14 DIAGNOSIS — O3421 Maternal care for scar from previous cesarean delivery: Secondary | ICD-10-CM | POA: Diagnosis present

## 2014-11-14 DIAGNOSIS — Z8249 Family history of ischemic heart disease and other diseases of the circulatory system: Secondary | ICD-10-CM

## 2014-11-14 DIAGNOSIS — O4693 Antepartum hemorrhage, unspecified, third trimester: Secondary | ICD-10-CM | POA: Diagnosis present

## 2014-11-14 DIAGNOSIS — O4690 Antepartum hemorrhage, unspecified, unspecified trimester: Secondary | ICD-10-CM | POA: Diagnosis present

## 2014-11-14 LAB — TYPE AND SCREEN
ABO/RH(D): B POS
ANTIBODY SCREEN: NEGATIVE

## 2014-11-14 LAB — CBC
HEMATOCRIT: 34 % — AB (ref 36.0–46.0)
Hemoglobin: 11.2 g/dL — ABNORMAL LOW (ref 12.0–15.0)
MCH: 27.4 pg (ref 26.0–34.0)
MCHC: 32.9 g/dL (ref 30.0–36.0)
MCV: 83.1 fL (ref 78.0–100.0)
PLATELETS: 212 10*3/uL (ref 150–400)
RBC: 4.09 MIL/uL (ref 3.87–5.11)
RDW: 19 % — ABNORMAL HIGH (ref 11.5–15.5)
WBC: 6.5 10*3/uL (ref 4.0–10.5)

## 2014-11-14 LAB — PROTIME-INR
INR: 1.06 (ref 0.00–1.49)
Prothrombin Time: 13.9 seconds (ref 11.6–15.2)

## 2014-11-14 LAB — APTT: APTT: 29 s (ref 24–37)

## 2014-11-14 LAB — RAPID HIV SCREEN (WH-MAU): SUDS RAPID HIV SCREEN: NONREACTIVE

## 2014-11-14 LAB — HEPATITIS B SURFACE ANTIGEN: HEP B S AG: NEGATIVE

## 2014-11-14 MED ORDER — ZOLPIDEM TARTRATE 5 MG PO TABS
5.0000 mg | ORAL_TABLET | Freq: Every evening | ORAL | Status: DC | PRN
Start: 2014-11-14 — End: 2014-11-16
  Administered 2014-11-15: 5 mg via ORAL
  Filled 2014-11-14: qty 1

## 2014-11-14 MED ORDER — SODIUM CHLORIDE 0.9 % IV SOLN
2.0000 g | Freq: Four times a day (QID) | INTRAVENOUS | Status: DC
  Administered 2014-11-14 (×2): 2 g via INTRAVENOUS
  Filled 2014-11-14 (×4): qty 2000

## 2014-11-14 MED ORDER — OXYTOCIN 40 UNITS IN LACTATED RINGERS INFUSION - SIMPLE MED
62.5000 mL/h | INTRAVENOUS | Status: DC
  Administered 2014-11-14: 62.5 mL/h via INTRAVENOUS

## 2014-11-14 MED ORDER — OXYCODONE-ACETAMINOPHEN 5-325 MG PO TABS: 2.0000 | ORAL_TABLET | ORAL | Status: DC | PRN

## 2014-11-14 MED ORDER — PHENYLEPHRINE 40 MCG/ML (10ML) SYRINGE FOR IV PUSH (FOR BLOOD PRESSURE SUPPORT)
PREFILLED_SYRINGE | INTRAVENOUS | Status: AC
  Filled 2014-11-14: qty 10

## 2014-11-14 MED ORDER — LIDOCAINE HCL (PF) 1 % IJ SOLN
INTRAMUSCULAR | Status: DC | PRN
  Administered 2014-11-14: 3 mL
  Administered 2014-11-14 (×2): 5 mL

## 2014-11-14 MED ORDER — LACTATED RINGERS IV SOLN
INTRAVENOUS | Status: DC
  Administered 2014-11-14 (×3): via INTRAVENOUS

## 2014-11-14 MED ORDER — ZOLPIDEM TARTRATE 5 MG PO TABS: 5.0000 mg | ORAL_TABLET | Freq: Every evening | ORAL | Status: DC | PRN

## 2014-11-14 MED ORDER — IBUPROFEN 600 MG PO TABS
600.0000 mg | ORAL_TABLET | Freq: Four times a day (QID) | ORAL | Status: DC
  Administered 2014-11-14 – 2014-11-16 (×6): 600 mg via ORAL
  Filled 2014-11-14 (×5): qty 1

## 2014-11-14 MED ORDER — OXYTOCIN 40 UNITS IN LACTATED RINGERS INFUSION - SIMPLE MED
INTRAVENOUS | Status: AC
  Filled 2014-11-14: qty 1000

## 2014-11-14 MED ORDER — CALCIUM CARBONATE ANTACID 500 MG PO CHEW: 2.0000 | CHEWABLE_TABLET | ORAL | Status: DC | PRN

## 2014-11-14 MED ORDER — ONDANSETRON HCL 4 MG/2ML IJ SOLN: 4.0000 mg | INTRAMUSCULAR | Status: DC | PRN

## 2014-11-14 MED ORDER — BETAMETHASONE SOD PHOS & ACET 6 (3-3) MG/ML IJ SUSP
12.0000 mg | INTRAMUSCULAR | Status: DC
  Administered 2014-11-14: 12 mg via INTRAMUSCULAR
  Filled 2014-11-14 (×2): qty 2

## 2014-11-14 MED ORDER — DOCUSATE SODIUM 100 MG PO CAPS: 100.0000 mg | ORAL_CAPSULE | Freq: Every day | ORAL | Status: DC

## 2014-11-14 MED ORDER — PHENYLEPHRINE 40 MCG/ML (10ML) SYRINGE FOR IV PUSH (FOR BLOOD PRESSURE SUPPORT)
80.0000 ug | PREFILLED_SYRINGE | INTRAVENOUS | Status: DC | PRN
  Filled 2014-11-14: qty 2

## 2014-11-14 MED ORDER — SENNOSIDES-DOCUSATE SODIUM 8.6-50 MG PO TABS
2.0000 | ORAL_TABLET | ORAL | Status: DC
  Administered 2014-11-15: 2 via ORAL
  Filled 2014-11-14: qty 2

## 2014-11-14 MED ORDER — PHENYLEPHRINE 40 MCG/ML (10ML) SYRINGE FOR IV PUSH (FOR BLOOD PRESSURE SUPPORT)
80.0000 ug | PREFILLED_SYRINGE | INTRAVENOUS | Status: DC | PRN
  Administered 2014-11-14: 80 ug via INTRAVENOUS
  Filled 2014-11-14: qty 2

## 2014-11-14 MED ORDER — DIBUCAINE 1 % RE OINT: 1.0000 "application " | TOPICAL_OINTMENT | RECTAL | Status: DC | PRN

## 2014-11-14 MED ORDER — BUTORPHANOL TARTRATE 1 MG/ML IJ SOLN
1.0000 mg | INTRAMUSCULAR | Status: DC | PRN
  Administered 2014-11-14 (×3): 1 mg via INTRAVENOUS
  Filled 2014-11-14 (×3): qty 1

## 2014-11-14 MED ORDER — PRENATAL MULTIVITAMIN CH
1.0000 | ORAL_TABLET | Freq: Every day | ORAL | Status: DC
  Administered 2014-11-15: 1 via ORAL
  Filled 2014-11-14: qty 1

## 2014-11-14 MED ORDER — ONDANSETRON HCL 4 MG PO TABS: 4.0000 mg | ORAL_TABLET | ORAL | Status: DC | PRN

## 2014-11-14 MED ORDER — ACETAMINOPHEN 325 MG PO TABS: 650.0000 mg | ORAL_TABLET | ORAL | Status: DC | PRN

## 2014-11-14 MED ORDER — MAGNESIUM SULFATE 40 G IN LACTATED RINGERS - SIMPLE
2.0000 g/h | INTRAVENOUS | Status: DC
  Administered 2014-11-14: 2 g/h via INTRAVENOUS
  Administered 2014-11-14: 3 g/h via INTRAVENOUS
  Filled 2014-11-14: qty 500

## 2014-11-14 MED ORDER — LACTATED RINGERS IV SOLN: 500.0000 mL | Freq: Once | INTRAVENOUS | Status: DC

## 2014-11-14 MED ORDER — DIPHENHYDRAMINE HCL 50 MG/ML IJ SOLN: 12.5000 mg | INTRAMUSCULAR | Status: DC | PRN

## 2014-11-14 MED ORDER — TETANUS-DIPHTH-ACELL PERTUSSIS 5-2.5-18.5 LF-MCG/0.5 IM SUSP: 0.5000 mL | Freq: Once | INTRAMUSCULAR | Status: DC

## 2014-11-14 MED ORDER — FERROUS SULFATE 325 (65 FE) MG PO TABS
325.0000 mg | ORAL_TABLET | Freq: Two times a day (BID) | ORAL | Status: DC
  Administered 2014-11-14 – 2014-11-16 (×4): 325 mg via ORAL
  Filled 2014-11-14 (×4): qty 1

## 2014-11-14 MED ORDER — FENTANYL 2.5 MCG/ML BUPIVACAINE 1/10 % EPIDURAL INFUSION (WH - ANES)
INTRAMUSCULAR | Status: DC | PRN
Start: 2014-11-14 — End: 2014-11-17
  Administered 2014-11-14: 14 mL/h via EPIDURAL

## 2014-11-14 MED ORDER — LIDOCAINE HCL (PF) 1 % IJ SOLN
INTRAMUSCULAR | Status: AC
  Filled 2014-11-14: qty 30

## 2014-11-14 MED ORDER — SIMETHICONE 80 MG PO CHEW: 80.0000 mg | CHEWABLE_TABLET | ORAL | Status: DC | PRN

## 2014-11-14 MED ORDER — DIPHENHYDRAMINE HCL 25 MG PO CAPS: 25.0000 mg | ORAL_CAPSULE | Freq: Four times a day (QID) | ORAL | Status: DC | PRN

## 2014-11-14 MED ORDER — OXYCODONE-ACETAMINOPHEN 5-325 MG PO TABS
1.0000 | ORAL_TABLET | ORAL | Status: DC | PRN
  Administered 2014-11-15 (×2): 1 via ORAL
  Filled 2014-11-14 (×2): qty 1

## 2014-11-14 MED ORDER — LANOLIN HYDROUS EX OINT: TOPICAL_OINTMENT | CUTANEOUS | Status: DC | PRN

## 2014-11-14 MED ORDER — MAGNESIUM SULFATE BOLUS VIA INFUSION
4.0000 g | Freq: Once | INTRAVENOUS | Status: AC
  Administered 2014-11-14: 4 g via INTRAVENOUS
  Filled 2014-11-14: qty 500

## 2014-11-14 MED ORDER — FENTANYL 2.5 MCG/ML BUPIVACAINE 1/10 % EPIDURAL INFUSION (WH - ANES)
INTRAMUSCULAR | Status: AC
  Filled 2014-11-14: qty 125

## 2014-11-14 MED ORDER — PRENATAL MULTIVITAMIN CH: 1.0000 | ORAL_TABLET | Freq: Every day | ORAL | Status: DC

## 2014-11-14 MED ORDER — BENZOCAINE-MENTHOL 20-0.5 % EX AERO: 1.0000 "application " | INHALATION_SPRAY | CUTANEOUS | Status: DC | PRN

## 2014-11-14 MED ORDER — EPHEDRINE 5 MG/ML INJ
10.0000 mg | INTRAVENOUS | Status: DC | PRN
  Filled 2014-11-14: qty 2

## 2014-11-14 MED ORDER — FENTANYL 2.5 MCG/ML BUPIVACAINE 1/10 % EPIDURAL INFUSION (WH - ANES)
14.0000 mL/h | INTRAMUSCULAR | Status: DC | PRN
Start: 2014-11-14 — End: 2014-11-14

## 2014-11-14 MED ORDER — MAGNESIUM SULFATE 40 G IN LACTATED RINGERS - SIMPLE
3.0000 g/h | INTRAVENOUS | Status: DC
  Filled 2014-11-14: qty 500

## 2014-11-14 MED ORDER — WITCH HAZEL-GLYCERIN EX PADS: 1.0000 "application " | MEDICATED_PAD | CUTANEOUS | Status: DC | PRN

## 2014-11-14 NOTE — Anesthesia Preprocedure Evaluation (Signed)
Anesthesia Evaluation  Patient identified by MRN, date of birth, ID band Patient awake    Reviewed: Allergy & Precautions, H&P , NPO status , Patient's Chart, lab work & pertinent test results  History of Anesthesia Complications Negative for: history of anesthetic complications  Airway Mallampati: II TM Distance: >3 FB Neck ROM: full    Dental no notable dental hx. (+) Teeth Intact   Pulmonary neg pulmonary ROS, asthma ,  breath sounds clear to auscultation  Pulmonary exam normal       Cardiovascular negative cardio ROS  Rhythm:regular Rate:Normal     Neuro/Psych negative neurological ROS  negative psych ROS   GI/Hepatic negative GI ROS, Neg liver ROS,   Endo/Other  negative endocrine ROS  Renal/GU negative Renal ROS  negative genitourinary   Musculoskeletal   Abdominal Normal abdominal exam  (+)   Peds  Hematology negative hematology ROS (+)   Anesthesia Other Findings   Reproductive/Obstetrics (+) Pregnancy                           Anesthesia Physical Anesthesia Plan  ASA: II  Anesthesia Plan: Epidural   Post-op Pain Management:    Induction:   Airway Management Planned:   Additional Equipment:   Intra-op Plan:   Post-operative Plan:   Informed Consent: I have reviewed the patients History and Physical, chart, labs and discussed the procedure including the risks, benefits and alternatives for the proposed anesthesia with the patient or authorized representative who has indicated his/her understanding and acceptance.     Plan Discussed with:   Anesthesia Plan Comments:         Anesthesia Quick Evaluation  

## 2014-11-14 NOTE — MAU Provider Note (Signed)
History     CSN: [id number]  Arrival date and time: 11/14/14 0218   None     Chief Complaint  Patient presents with  . Vaginal Bleeding   HPI This is a 34 y.o. female at 4236w1d who presents via EMS with c/o sudden onset of vaginal bleeding and cramping. Uncomfortable with contractions. Has recently been admitted after a fall but there were no significant sequelae from that. States was having mild contractions all day but they did not hurt. UCs now hurt. Has not felt much fetal movement tonight. Has had 4 deliveries, second one was a C/S for asthma attack. This was followed by 2 VBACs.  RN Note: Patient states she was watching television and all of a sudden started to have vaginal bleeding and cramping around 0130. Patient had a fall last Friday and was admitted to the hospital for a 3 night stay and sent home and this is the first time having any vaginal bleeding since then. Patient states she has felt baby move today, but not since she started bleeding.          OB History    Gravida Para Term Preterm AB TAB SAB Ectopic Multiple Living   6 4 3 1 1 1  0   4      Past Medical History  Diagnosis Date  . Asthma   . Preterm labor     Past Surgical History  Procedure Laterality Date  . Cesarean section    . Dilation and evacuation N/A 01/21/2014    Procedure: DILATATION AND EVACUATION;  Surgeon: Lesly DukesKelly H Leggett, MD;  Location: [facility];  Service: Gynecology;  Laterality: N/A;    Family History  Problem Relation Age of Onset  . Hypertension Mother     History  Substance Use Topics  . Smoking status: Never Smoker   . Smokeless tobacco: Not on file  . Alcohol Use: No    Allergies: No Known Allergies  Prescriptions prior to admission  Medication Sig Dispense Refill Last Dose  . NIFEdipine (PROCARDIA-XL/ADALAT CC) 30 MG 24 hr tablet Take 1 tablet (30 mg total) by mouth 2 (two) times daily. 30 tablet 0 11/13/2014 at Unknown time    Review of Systems  Constitutional:  Negative for fever and chills.  Gastrointestinal: Positive for abdominal pain. Negative for nausea, vomiting, diarrhea and constipation.  Genitourinary: Negative for dysuria.       Heavy vaginal bleeding   Neurological: Negative for dizziness.   Physical Exam   Blood pressure 132/82, pulse 105, resp. rate 22, SpO2 100 %, unknown if currently breastfeeding.  Physical Exam  Constitutional: She is oriented to person, place, and time. She appears well-developed and well-nourished. No distress.  HENT:  Head: Normocephalic.  Cardiovascular: Normal rate.   Respiratory: Effort normal.  GI: Soft. She exhibits no distension. There is no tenderness (mildly firm, not very tender). There is no rebound and no guarding.  Genitourinary: Vaginal discharge (moderate vaginal bleeding) found.  Dilation: 1 Effacement (%): 30 Station: Ballotable Exam by:: Wynelle BourgeoisMarie Mariyanna Mucha, CNM   Musculoskeletal: Normal range of motion.  Neurological: She is alert and oriented to person, place, and time.  Skin: Skin is warm and dry.  Psychiatric: She has a normal mood and affect.    MAU Course  Procedures  MDM Dr Gaynell FaceMarshall consulted. Labs done. Already has IV. WIll order US. Will give Betamethasone.  Will start magnesium sulfate in order to get B-meth.  Assessment and Plan  A:  SIUP at 4236w1d  Bleeding in third trimester, possible abruption       Preterm contractions  P:  Admit to Antenatal       Routine orders       Type and screen        Magnesium sulfate infusion        Betamethasone.  [facility] 11/14/2014, 2:55 AM

## 2014-11-14 NOTE — H&P (Signed)
This is Dr. Francoise CeoBernard Marshall dictating the history and physical on  Katherine Mcguire  she's a 34 year old gravida 6 para 3114 at 8933 weeks and a day due  4 9 16   patient states that she's been contracting all day yesterday and she's been on Procardia and the medicine was not working but she did not think  Of coming to  hospital around midnight last night she noticed that she was having bleeding and still contracting came to the hospital and her cervix  Was  1 cm 100% and she wasn't doing any active bleeding at the time she was started on magnesium sulfate 4 g loading 2 g  An hour  and given betamethasone 12 mg IM to get a repeat dose today however patient is still contracting on the magnesium and she's no up to 3 g  An hour for f 2 hours her cervix is 4 cm 100% with the vertex at -2-3 station membranes intact Past medical history she had a C-section 1 Past surgical history negative Social history negative System review negative Physical exam well-developed female contracting irregularly HEENT negative Lungs clear to P&A Heart regular rhythm no murmurs no gallops Breasts negative Abdomen 36 week size Pelvic as described above Extremities negative

## 2014-11-14 NOTE — Anesthesia Procedure Notes (Signed)
Epidural Patient location during procedure: OB  Staffing Anesthesiologist: Kentrel Clevenger Performed by: anesthesiologist   Preanesthetic Checklist Completed: patient identified, site marked, surgical consent, pre-op evaluation, timeout performed, IV checked, risks and benefits discussed and monitors and equipment checked  Epidural Patient position: sitting Prep: ChloraPrep Patient monitoring: heart rate, continuous pulse ox and blood pressure Approach: right paramedian Location: L3-L4 Injection technique: LOR saline  Needle:  Needle type: Tuohy  Needle gauge: 17 G Needle length: 9 cm and 9 Needle insertion depth: 6 cm Catheter type: closed end flexible Catheter size: 20 Guage Catheter at skin depth: 11 cm Test dose: negative  Assessment Events: blood not aspirated, injection not painful, no injection resistance, negative IV test and no paresthesia  Additional Notes   Patient tolerated the insertion well without complications.   

## 2014-11-14 NOTE — Plan of Care (Signed)
Problem: Phase II Progression Outcomes Goal: Pain controlled on oral analgesia Outcome: Completed/Met Date Met:  11/14/14 Good pain control on po Motrin,but is aware she has more options. Goal: Progress activity as tolerated unless otherwise ordered Outcome: Completed/Met Date Met:  11/14/14 Tolerates walking in room without difficulty. Goal: Tolerating diet Outcome: Completed/Met Date Met:  11/14/14 Tolerates a Regular diet well. Goal: Other Phase II Outcomes/Goals Outcome: Completed/Met Date Met:  11/14/14 Voiding without any difficulty.

## 2014-11-14 NOTE — MAU Note (Signed)
Patient states she was watching television and all of a sudden started to have vaginal bleeding and cramping around 0130.  Patient had a fall last Friday and was admitted to the hospital for a 3 night stay and sent home and this is the first time having any vaginal bleeding since then.  Patient states she has felt baby move today, but not since she started bleeding.

## 2014-11-14 NOTE — Progress Notes (Signed)
[facility] via stretcher by Leafy RoKatie Koontz RN and Durwin Regesara Gordon NT

## 2014-11-15 LAB — CBC
HEMATOCRIT: 28.1 % — AB (ref 36.0–46.0)
HEMOGLOBIN: 9.3 g/dL — AB (ref 12.0–15.0)
MCH: 27.4 pg (ref 26.0–34.0)
MCHC: 33.1 g/dL (ref 30.0–36.0)
MCV: 82.6 fL (ref 78.0–100.0)
Platelets: 233 10*3/uL (ref 150–400)
RBC: 3.4 MIL/uL — ABNORMAL LOW (ref 3.87–5.11)
RDW: 19.2 % — ABNORMAL HIGH (ref 11.5–15.5)
WBC: 12.2 10*3/uL — AB (ref 4.0–10.5)

## 2014-11-15 LAB — RUBELLA SCREEN: Rubella: 14.8 index (ref >0.99)

## 2014-11-15 LAB — RPR: RPR Ser Ql: NONREACTIVE

## 2014-11-15 NOTE — Progress Notes (Signed)
Clinical Social Work Department PSYCHOSOCIAL ASSESSMENT - MATERNAL/CHILD 11/15/2014  Patient:  Katherine Mcguire,Katherine Mcguire  Account Number:  [id number]  Admit Date:  11/14/2014  Childs Name:   Katherine Mcguire    Clinical Social Worker:  Katherine Belk, LCSW   Date/Time:  11/15/2014 02:56 PM  Date Referred:        Other referral source:   No referral-NICU admission    I:  FAMILY / HOME ENVIRONMENT Child's legal guardian:  PARENT  Guardian - Name Guardian - Age Guardian - Address  Katherine Mcguire 33 [address], [place], [place] [address]  Katherine Mcguire  same   Other household support members/support persons Name Relationship DOB   SON 11   DAUGHTER 9   DAUGHTER 7   SON 3   Other support:   MOB states she has a great support system.  She states her mother, brother and sister live in [place] and were here yesterday visiting.    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  FOB works as a machine operator at [employer].  She states he is also in school for Engineering at [employer].   Financial resources:  Private Insurance If Medicaid - County:    School / Grade:   Maternity Care Coordinator / Child Services Coordination / Early Interventions:  Cultural issues impacting care:   None stated    III  STRENGTHS Strengths  Adequate Resources  Compliance with medical plan  Other - See comment  Supportive family/friends  Understanding of illness   Strength comment:  MOB states her children have gone to [facility] for pediatric care, however, she does not like that recently she has seen a different doctor every time she has been. She states she may wish to find a new pediatrician.  CSW advised she get a pediatrician list from the NICU and call to see who is accepting new patients.  MOB has private insurance, but she thinks baby will have Medicaid.   IV  RISK FACTORS AND CURRENT PROBLEMS Current  Problem:  None   Risk Factor & Current Problem Patient Issue Family Issue Risk Factor / Current Problem Comment   N N     V  SOCIAL WORK ASSESSMENT  CSW met with MOB in her third floor room/302 to introduce myself, offer support and complete assessment due to baby's admission to NICU at 33.1 weeks.  MOB was on the phone when CSW arrived, but quickly ended the call and welcomed CSW into her room.  She was extremely friendly and talkative.  Her 3.5 year old son, Katherine Mcguire, was with her.  MOB states this is her first premature baby and was born early due to a bad fall she had at home.  She states she was cleaning the floor and she slipped and fell.  She states her back is still hurting pretty badly.  She states her husband told her not to worry about the house, but she "didn't listen."  She now says, "I wish I would have listened."  But, she states she is not feeling guilty.  She also reports that she was actually 34.[redacted] weeks pregnant, not 33.1, which is listed on the NICU census.  She states she does not think baby will have to be here very long.  She is concerned that they baby will need to have a feeding tube, and does not want her to have one.  CSW spoke   about what to expect from a NICU admission/milestones baby has to meet before discharge, in general terms, while encouraging MOB to be patient with baby and allow NICU team to do what is best for baby.  CSW explained that a feeding tube is extremely normal and necessary for some babies while they learn how to bottle feed.  MOB seemed understanding and was easy to engage in conversation.  She states no PPD with her other children.  She states she will cry tomorrow when she discharges and she cried when the staff said they had to take the baby to the NICU.  CSW encouraged her to cry tomorrow and told her that it's normal if she cries every time she leaves from seeing baby at the hospital.  MOB does not think she will be emotional, but understands that it's normal if  she is.  MOB states they do not have everything they need yet for baby, but have the means to get it.  She states she wants to replace all of the carpet in her home before baby comes home so baby is not exposed to germs that are in the old carpet.  She states her youngest son will be going to [place] for a week or two to stay with her mother and then her brother (20) and sister (18) will be coming to her home over spring break from college in March to help with the children and help her clean her house.  MOB presents as very calm and appropriately coping.  She reports having good supports and no questions, concerns or needs for CSW at this time.  She seemed appreciative of the visit.  CSW explained ongoing support services offered by NICU CSW and gave contact information.  CSW is not aware of any social concerns at this time.     VI SOCIAL WORK PLAN Social Work Plan  Psychosocial Support/Ongoing Assessment of Needs  Patient/Family Education   Type of pt/family education:   Ongoing support services offered by NICU CSW  Common emotions related to the NICU experience  What to expect from a NICU admisison (in general terms).   If child protective services report - county:   If child protective services report - date:   Information/referral to community resources comment:   No referral needs noted at this time.   Other social work plan:     

## 2014-11-15 NOTE — Progress Notes (Signed)
Ur chart review completed.  

## 2014-11-15 NOTE — Lactation Note (Signed)
This note was copied from the chart of Katherine Mckynlie Cudney. Lactation Consultation Note  Initial visit with mom in the NICU.  Mom states this is her fifth baby and she breastfed all her babies for 17 months.  She had one previous late preterm baby at 35 weeks.  Reviewed importance of pumping every 3 hours for 15 minutes.  [facility]  Referral faxed.  Encouraged to call for concerns/assist prn.  Patient Name: Katherine Mcguire Reason for consult: Initial assessment;NICU baby   Maternal Data    Feeding    LATCH Score/Interventions                      Lactation Tools Discussed/Used WIC Program: Yes Initiated by:: RN Date initiated:: 11/14/14   Consult Status Consult Status: Follow-up Date: 11/16/14 Follow-up type: In-patient    Huston FoleyMOULDEN, Katherine Mcguire Mcguire, 12:02 PM

## 2014-11-15 NOTE — Progress Notes (Signed)
Patient ID: Katherine Mcguire, female   DOB: 1981/03/25, 34 y.o.   MRN: [id number] Postpartum day one Vital signs normal Fundus firm Lochia moderate Doing well

## 2014-11-15 NOTE — Anesthesia Postprocedure Evaluation (Signed)
  Anesthesia Post-op Note  Patient: Katherine Mcguire  Procedure(s) Performed: * No procedures listed *  Patient Location: Women's Unit  Anesthesia Type:Epidural  Level of Consciousness: awake, alert , oriented and patient cooperative  Airway and Oxygen Therapy: Patient Spontanous Breathing  Post-op Pain: none  Post-op Assessment: Post-op Vital signs reviewed, Patient's Cardiovascular Status Stable, Respiratory Function Stable, Patent Airway, No headache, No backache, No residual numbness and No residual motor weakness  Post-op Vital Signs: Reviewed and stable  Last Vitals:  Filed Vitals:   11/15/14 0601  BP: 114/63  Pulse: 106  Temp: 37 C  Resp: 18    Complications: No apparent anesthesia complications

## 2014-11-16 NOTE — Progress Notes (Signed)
Pt. Is discharged in the care of Sister with N.T. Escort. Denies any pain or discomfort. Spirits are good. Infant to remain in NICu. Discharged instructions with Rx were given to pt . States she understands all instructions well Questions asked and answered.Downstairs per ambulatory. No equipment needed for home use.

## 2014-11-16 NOTE — Discharge Instructions (Signed)
Discharge instructions   You can wash your hair  Shower  Eat what you want  Drink what you want  See me in 6 weeks  Your ankles are going to swell more in the next 2 weeks than when pregnant  No sex for 6 weeks   Katherine Mcguire A, MD 11/16/2014

## 2014-11-16 NOTE — Discharge Summary (Signed)
Obstetric Discharge Summary Reason for Admission: observation/evaluation Prenatal Procedures: none Intrapartum Procedures: spontaneous vaginal delivery Postpartum Procedures: none Complications-Operative and Postpartum: none HEMOGLOBIN  Date Value Ref Range Status  11/15/2014 9.3* 12.0 - 15.0 g/dL Final    Comment:    DELTA CHECK NOTED REPEATED TO VERIFY    HCT  Date Value Ref Range Status  11/15/2014 28.1* 36.0 - 46.0 % Final    Physical Exam:  General: alert Lochia: appropriate Uterine Fundus: firm Incision: healing well DVT Evaluation: No evidence of DVT seen on physical exam.  Discharge Diagnoses: Premature labor  Discharge Information: Date: 11/16/2014 Activity: pelvic rest Diet: routine Medications: Percocet Condition: stable Instructions: refer to practice specific booklet Discharge to: home Follow-up Information    Follow up with Kathreen CosierMARSHALL,Nomie Buchberger A, MD.   Specialty:  Obstetrics and Gynecology   Contact information:   [address] [place] [id number] [phone]       Newborn Data: Live born female  Birth Weight: 4 lb 12.2 oz (2160 g) APGAR: 8, 9  Home with baby in nicu.  Mert Dietrick A 11/16/2014, 6:23 AM

## 2014-11-16 NOTE — Progress Notes (Signed)
Patient ID: Katherine Mcguire, female   DOB: [birth year], 34 y.o.   MRN: [id number] Postpartum day 2 Vital signs normal Fundus firm Lochia moderate Home today

## 2014-11-21 ENCOUNTER — Inpatient Hospital Stay (HOSPITAL_COMMUNITY)
Admission: AD | Admit: 2014-11-21 | Discharge: 2014-11-21 | Disposition: A | Discharge status: Home / Self Care | Payer: BLUE CROSS/BLUE SHIELD | Source: Ambulatory Visit | Attending: Obstetrics | Admitting: Obstetrics

## 2014-11-21 ENCOUNTER — Encounter (HOSPITAL_COMMUNITY): Payer: Self-pay

## 2014-11-21 DIAGNOSIS — R519 Headache, unspecified: Secondary | ICD-10-CM

## 2014-11-21 DIAGNOSIS — I10 Essential (primary) hypertension: Secondary | ICD-10-CM | POA: Insufficient documentation

## 2014-11-21 DIAGNOSIS — O9089 Other complications of the puerperium, not elsewhere classified: Secondary | ICD-10-CM | POA: Diagnosis not present

## 2014-11-21 DIAGNOSIS — R51 Headache: Secondary | ICD-10-CM | POA: Diagnosis present

## 2014-11-21 DIAGNOSIS — G479 Sleep disorder, unspecified: Secondary | ICD-10-CM | POA: Diagnosis not present

## 2014-11-21 LAB — COMPREHENSIVE METABOLIC PANEL
ALK PHOS: 71 U/L (ref 39–117)
ALT: 34 U/L (ref 0–35)
AST: 22 U/L (ref 0–37)
Albumin: 3.7 g/dL (ref 3.5–5.2)
Anion gap: 4 — ABNORMAL LOW (ref 5–15)
BUN: 9 mg/dL (ref 6–23)
CHLORIDE: 110 mmol/L (ref 96–112)
CO2: 23 mmol/L (ref 19–32)
Calcium: 8.9 mg/dL (ref 8.4–10.5)
Creatinine, Ser: 0.49 mg/dL — ABNORMAL LOW (ref 0.50–1.10)
GFR calc Af Amer: >90 mL/min (ref >90)
Glucose, Bld: 90 mg/dL (ref 70–99)
Potassium: 4 mmol/L (ref 3.5–5.1)
Sodium: 137 mmol/L (ref 135–145)
TOTAL PROTEIN: 7.2 g/dL (ref 6.0–8.3)
Total Bilirubin: 0.3 mg/dL (ref 0.3–1.2)

## 2014-11-21 LAB — CBC
HEMATOCRIT: 38.5 % (ref 36.0–46.0)
HEMOGLOBIN: 12.5 g/dL (ref 12.0–15.0)
MCH: 27.2 pg (ref 26.0–34.0)
MCHC: 32.5 g/dL (ref 30.0–36.0)
MCV: 83.7 fL (ref 78.0–100.0)
Platelets: 294 10*3/uL (ref 150–400)
RBC: 4.6 MIL/uL (ref 3.87–5.11)
RDW: 19.1 % — ABNORMAL HIGH (ref 11.5–15.5)
WBC: 5.8 10*3/uL (ref 4.0–10.5)

## 2014-11-21 LAB — URINALYSIS, ROUTINE W REFLEX MICROSCOPIC
Bilirubin Urine: NEGATIVE
Glucose, UA: NEGATIVE mg/dL
Ketones, ur: NEGATIVE mg/dL
Leukocytes, UA: NEGATIVE
Nitrite: NEGATIVE
Protein, ur: NEGATIVE mg/dL
SPECIFIC GRAVITY, URINE: 1.01 (ref 1.005–1.030)
Urobilinogen, UA: 0.2 mg/dL (ref 0.0–1.0)
pH: 6.5 (ref 5.0–8.0)

## 2014-11-21 LAB — URINE MICROSCOPIC-ADD ON

## 2014-11-21 MED ORDER — METOCLOPRAMIDE HCL 10 MG PO TABS
10.0000 mg | ORAL_TABLET | Freq: Once | ORAL | Status: AC
  Administered 2014-11-21: 10 mg via ORAL
  Filled 2014-11-21: qty 1

## 2014-11-21 MED ORDER — DIPHENHYDRAMINE HCL 25 MG PO CAPS: 25.0000 mg | ORAL_CAPSULE | Freq: Every evening | ORAL | Status: DC | PRN

## 2014-11-21 MED ORDER — ACETAMINOPHEN 325 MG PO TABS: 650.0000 mg | ORAL_TABLET | Freq: Once | ORAL | Status: DC

## 2014-11-21 MED ORDER — BUTALBITAL-APAP-CAFFEINE 50-325-40 MG PO TABS
1.0000 | ORAL_TABLET | Freq: Once | ORAL | Status: AC
  Administered 2014-11-21: 1 via ORAL
  Filled 2014-11-21: qty 1

## 2014-11-21 NOTE — MAU Note (Signed)
Pt brought to MAU by  NICU nurse to be evaluated for blood pressure. Pt delivered 7 days ago and is taking medications for her blood pressure but has had a bad headache for a couple of days. Pt states she has not been taking her blood pressure medications as prescribed

## 2014-11-21 NOTE — Discharge Instructions (Signed)

## 2014-11-21 NOTE — MAU Provider Note (Signed)
History     CSN: [id number]  Arrival date and time: 11/21/14 1046   None     Chief Complaint  Patient presents with  . Headache   HPI This is a 34 y.o. female at 28w1dwho presents for evaluation from NICU, where she had reported having a headache.  Has been on meds for hypertension but admits she doesn't always take them as directed. Has Not been sleeping well  RN Note:  Expand All Collapse All   Pt brought to MAU by NICU nurse to be evaluated for blood pressure. Pt delivered 7 days ago and is taking medications for her blood pressure but has had a bad headache for a couple of days. Pt states she has not been taking her blood pressure medications as prescribed          OB History    Gravida Para Term Preterm AB TAB SAB Ectopic Multiple Living   _0 0  0 5      Past Medical History  Diagnosis Date  . Asthma   . Preterm labor     Past Surgical History  Procedure Laterality Date  . Cesarean section    . Dilation and evacuation N/A 01/21/2014    Procedure: DILATATION AND EVACUATION;  Surgeon: KGuss Bunde MD;  [facility];  Service: Gynecology;  Laterality: N/A;    Family History  Problem Relation Age of Onset  . Hypertension Mother     History  Substance Use Topics  . Smoking status: Never Smoker   . Smokeless tobacco: Not on file  . Alcohol Use: No    Allergies: No Known Allergies  Prescriptions prior to admission  Medication Sig Dispense Refill Last Dose  . acetaminophen (TYLENOL) 500 MG tablet Take 1,000 mg by mouth every 6 (six) hours as needed for headache.   11/21/2014 at 0800  . NIFEdipine (PROCARDIA-XL/ADALAT-CC/NIFEDICAL-XL) 30 MG 24 hr tablet Take 30 mg by mouth 2 (two) times daily.   11/21/2014 at Unknown time    Review of Systems  Constitutional: Positive for malaise/fatigue (has not been sleeping well). Negative for fever and chills.  HENT: Negative for sore throat.   Eyes: Negative for blurred vision, double vision and  photophobia.  Cardiovascular: Negative for chest pain.  Gastrointestinal: Negative for nausea, vomiting and abdominal pain.  Musculoskeletal: Negative for myalgias, back pain and neck pain.  Neurological: Positive for headaches. Negative for dizziness, sensory change, speech change, focal weakness and weakness.   Physical Exam   Blood pressure 120/80, pulse 82, temperature 98.2 F (36.8 C), temperature source Oral, resp. rate 16, unknown if currently breastfeeding.  Physical Exam  Constitutional: She is oriented to person, place, and time. She appears well-developed and well-nourished. No distress.  HENT:  Head: Normocephalic.  Cardiovascular: Normal rate, regular rhythm and normal heart sounds.  Exam reveals no gallop and no friction rub.   No murmur heard. Respiratory: Effort normal and breath sounds normal. No respiratory distress.  GI: Soft. She exhibits no distension. There is no tenderness. There is no rebound and no guarding.  Musculoskeletal: Normal range of motion. She exhibits no edema.  Neurological: She is alert and oriented to person, place, and time. She has normal reflexes. She displays normal reflexes. She exhibits normal muscle tone.  Skin: Skin is warm and dry.  Psychiatric: She has a normal mood and affect.   Filed Vitals:   11/21/14 1145 11/21/14 1200 11/21/14 1215 11/21/14 1318  BP: 125/77 129/78  110/82 114/82  Pulse: 61 61 80 88  Temp:      TempSrc:      Resp:        MAU Course  Procedures  MDM PIH labs ordered Results for orders placed or performed during the hospital encounter of 11/21/14 (from the past 72 hour(s))  Urinalysis, Routine w reflex microscopic     Status: Abnormal   Collection Time: 11/21/14 10:58 AM  Result Value Ref Range   Color, Urine YELLOW YELLOW   APPearance CLEAR CLEAR   Specific Gravity, Urine 1.010 1.005 - 1.030   pH 6.5 5.0 - 8.0   Glucose, UA NEGATIVE NEGATIVE mg/dL   Hgb urine dipstick SMALL (A) NEGATIVE   Bilirubin  Urine NEGATIVE NEGATIVE   Ketones, ur NEGATIVE NEGATIVE mg/dL   Protein, ur NEGATIVE NEGATIVE mg/dL   Urobilinogen, UA 0.2 0.0 - 1.0 mg/dL   Nitrite NEGATIVE NEGATIVE   Leukocytes, UA NEGATIVE NEGATIVE  Urine microscopic-add on     Status: Abnormal   Collection Time: 11/21/14 10:58 AM  Result Value Ref Range   Squamous Epithelial / LPF FEW (A) RARE   WBC, UA 0-2 <3 WBC/hpf   RBC / HPF 0-2 <3 RBC/hpf   Bacteria, UA FEW (A) RARE  CBC     Status: Abnormal   Collection Time: 11/21/14 12:15 PM  Result Value Ref Range   WBC 5.8 4.0 - 10.5 K/uL   RBC 4.60 3.87 - 5.11 MIL/uL   Hemoglobin 12.5 12.0 - 15.0 g/dL   HCT 38.5 36.0 - 46.0 %   MCV 83.7 78.0 - 100.0 fL   MCH 27.2 26.0 - 34.0 pg   MCHC 32.5 30.0 - 36.0 g/dL   RDW 19.1 (H) 11.5 - 15.5 %   Platelets 294 150 - 400 K/uL  Comprehensive metabolic panel     Status: Abnormal   Collection Time: 11/21/14 12:15 PM  Result Value Ref Range   Sodium 137 135 - 145 mmol/L   Potassium 4.0 3.5 - 5.1 mmol/L   Chloride 110 96 - 112 mmol/L   CO2 23 19 - 32 mmol/L   Glucose, Bld 90 70 - 99 mg/dL   BUN 9 6 - 23 mg/dL   Creatinine, Ser 0.49 (L) 0.50 - 1.10 mg/dL   Calcium 8.9 8.4 - 10.5 mg/dL   Total Protein 7.2 6.0 - 8.3 g/dL   Albumin 3.7 3.5 - 5.2 g/dL   AST 22 0 - 37 U/L   ALT 34 0 - 35 U/L   Alkaline Phosphatase 71 39 - 117 U/L   Total Bilirubin 0.3 0.3 - 1.2 mg/dL   GFR calc non Af Amer >90 >90 mL/min   GFR calc Af Amer >90 >90 mL/min    Comment: (NOTE) The eGFR has been calculated using the CKD EPI equation. This calculation has not been validated in all clinical situations. eGFR's persistently <90 mL/min signify possible Chronic Kidney Disease.    Anion gap 4 (L) 5 - 15   Given Fioricet and Reglan with good relief  Assessment and Plan  A;  Postpartum       Chronic Hypertension, normal now with normal labs       Headache, improved       Sleep disturbance  P:  Discharge home       Preeclampsia precautions       Benadryl pRN  for sleep  [facility] 11/21/2014, 11:56 AM

## 2014-11-29 ENCOUNTER — Ambulatory Visit: Payer: Self-pay

## 2014-11-29 NOTE — Lactation Note (Signed)
This note was copied from the chart of Katherine Mcguire. Lactation Consultation Note  Met with mom in the NICU.  She states her pumping is going well and she has a very good milk supply.  Mom states she has put baby to breast and baby latched.  Encouraged to call for concerns or latch assist prn.  Patient Name: Katherine Mcguire [id number] Date: 11/29/2014     Maternal Data    Feeding Feeding Type: Breast Milk Nipple Type: Regular Length of feed: 20 min  LATCH Score/Interventions                      Lactation Tools Discussed/Used     Consult Status      Ave Filter 11/29/2014, 2:58 PM

## 2014-12-02 ENCOUNTER — Ambulatory Visit: Payer: Self-pay

## 2014-12-02 NOTE — Lactation Note (Signed)
This note was copied from the chart of Girl Giana Runquist. Lactation Consultation Note  Patient Name: Girl Earney Hamburgminata Oltmann Today's Date: 12/02/2014 Reason for consult: Follow-up assessment;NICU baby NICU baby 2 wk.o., 1475w5d CGA. Mom states that her baby has only had EBM during her almost 3-week stay in NICU. Applauded all of mom's hard work. Mom states that her supply has started to decrease over the last couple of days. Asked mom how often she is pumping and she states that she pumps once in the morning and once in the evening. Discussed supply and demand with mom and explained that the pump replaces having the baby at the breast. Discussed increasing daily pumping to every 3 hours/roughly 8 times a day as a minimum. Also discussed power-pumping to attempt to increase supply as well. Mom states that the baby is probably going home from NICU tomorrow. Enc mom to have baby STS and at the breast often. Enc mom to call for assistance with latching as needed. Mom aware of OP/BFSG and LC phone line assistance after D/C.   Maternal Data    Feeding    LATCH Score/Interventions                      Lactation Tools Discussed/Used     Consult Status Consult Status: PRN    Geralynn OchsWILLIARD, Ivon Roedel 12/02/2014, 2:17 PM

## 2015-01-17 ENCOUNTER — Encounter (HOSPITAL_COMMUNITY): Payer: Self-pay

## 2015-01-17 ENCOUNTER — Emergency Department (HOSPITAL_COMMUNITY)
Admission: EM | Admit: 2015-01-17 | Discharge: 2015-01-17 | Disposition: A | Discharge status: Home / Self Care | Payer: BLUE CROSS/BLUE SHIELD | Attending: Emergency Medicine | Admitting: Emergency Medicine

## 2015-01-17 DIAGNOSIS — Z79899 Other long term (current) drug therapy: Secondary | ICD-10-CM | POA: Insufficient documentation

## 2015-01-17 DIAGNOSIS — J45909 Unspecified asthma, uncomplicated: Secondary | ICD-10-CM | POA: Diagnosis not present

## 2015-01-17 DIAGNOSIS — M79604 Pain in right leg: Secondary | ICD-10-CM | POA: Insufficient documentation

## 2015-01-17 DIAGNOSIS — M79605 Pain in left leg: Secondary | ICD-10-CM | POA: Diagnosis not present

## 2015-01-17 DIAGNOSIS — Z3202 Encounter for pregnancy test, result negative: Secondary | ICD-10-CM | POA: Diagnosis not present

## 2015-01-17 DIAGNOSIS — Z8751 Personal history of pre-term labor: Secondary | ICD-10-CM | POA: Diagnosis not present

## 2015-01-17 DIAGNOSIS — M79606 Pain in leg, unspecified: Secondary | ICD-10-CM

## 2015-01-17 LAB — URINALYSIS, ROUTINE W REFLEX MICROSCOPIC
Bilirubin Urine: NEGATIVE
Glucose, UA: NEGATIVE mg/dL
Hgb urine dipstick: NEGATIVE
Ketones, ur: NEGATIVE mg/dL
Leukocytes, UA: NEGATIVE
NITRITE: NEGATIVE
PROTEIN: NEGATIVE mg/dL
Specific Gravity, Urine: 1.023 (ref 1.005–1.030)
Urobilinogen, UA: 0.2 mg/dL (ref 0.0–1.0)
pH: 6.5 (ref 5.0–8.0)

## 2015-01-17 LAB — BASIC METABOLIC PANEL
ANION GAP: 7 (ref 5–15)
BUN: 14 mg/dL (ref 6–23)
CALCIUM: 9.3 mg/dL (ref 8.4–10.5)
CO2: 23 mmol/L (ref 19–32)
Chloride: 107 mmol/L (ref 96–112)
Creatinine, Ser: 0.57 mg/dL (ref 0.50–1.10)
GFR calc Af Amer: >90 mL/min (ref >90)
GFR calc non Af Amer: >90 mL/min (ref >90)
Glucose, Bld: 115 mg/dL — ABNORMAL HIGH (ref 70–99)
Potassium: 3.9 mmol/L (ref 3.5–5.1)
Sodium: 137 mmol/L (ref 135–145)

## 2015-01-17 LAB — CBC WITH DIFFERENTIAL/PLATELET
Basophils Absolute: 0 10*3/uL (ref 0.0–0.1)
Basophils Relative: 0 % (ref 0–1)
EOS ABS: 0.1 10*3/uL (ref 0.0–0.7)
Eosinophils Relative: 2 % (ref 0–5)
HCT: 40.2 % (ref 36.0–46.0)
Hemoglobin: 13.1 g/dL (ref 12.0–15.0)
LYMPHS PCT: 53 % — AB (ref 12–46)
Lymphs Abs: 2.5 10*3/uL (ref 0.7–4.0)
MCH: 27.9 pg (ref 26.0–34.0)
MCHC: 32.6 g/dL (ref 30.0–36.0)
MCV: 85.5 fL (ref 78.0–100.0)
MONOS PCT: 11 % (ref 3–12)
Monocytes Absolute: 0.5 10*3/uL (ref 0.1–1.0)
Neutro Abs: 1.6 10*3/uL — ABNORMAL LOW (ref 1.7–7.7)
Neutrophils Relative %: 34 % — ABNORMAL LOW (ref 43–77)
Platelets: 233 10*3/uL (ref 150–400)
RBC: 4.7 MIL/uL (ref 3.87–5.11)
RDW: 15.6 % — ABNORMAL HIGH (ref 11.5–15.5)
WBC: 4.7 10*3/uL (ref 4.0–10.5)

## 2015-01-17 LAB — PREGNANCY, URINE: Preg Test, Ur: NEGATIVE

## 2015-01-17 NOTE — ED Provider Notes (Signed)
CSN: [id number]     Arrival date & time 01/17/15  1214 History  This chart was scribed for non-physician practitioner,Miyeko Mahlum Rubin PayorPickering, NP, working with No att. providers found, by Lionel DecemberHatice Demirci, ED Scribe. This patient was seen in room WTR6/WTR6 and the patient's care was started at 1:05 PM.    Chief Complaint  Patient presents with  . Leg Pain     (Consider location/radiation/quality/duration/timing/severity/associated sxs/prior Treatment) Patient is a 34 y.o. female presenting with leg pain. The history is provided by the patient. No language interpreter was used.  Leg Pain   HPI Comments: Katherine Mcguire is a 34 y.o. female who presents to the Emergency Department complaining constant bilateral leg (from her knees down) pain onset 4 days ago. She feels mild pain in her hips when she lays down.  She took ibuprofen for the pain but denies any relief.  Patient had a baby in February and is breastfeeding.  She denies any swelling, rash, or redness.  Patient has a history of asthma and does not have any other medical problems.  She has not yet had her period.     Past Medical History  Diagnosis Date  . Asthma   . Preterm labor    Past Surgical History  Procedure Laterality Date  . Cesarean section    . Dilation and evacuation N/A 01/21/2014    Procedure: DILATATION AND EVACUATION;  Surgeon: Lesly DukesKelly H Leggett, MD;  Location: [facility];  Service: Gynecology;  Laterality: N/A;   Family History  Problem Relation Age of Onset  . Hypertension Mother    History  Substance Use Topics  . Smoking status: Never Smoker   . Smokeless tobacco: Not on file  . Alcohol Use: No   OB History    Gravida Para Term Preterm AB TAB SAB Ectopic Multiple Living   6 5 3 2 1 1  0  0 5     Review of Systems  Musculoskeletal: Positive for myalgias.  All other systems reviewed and are negative.     Allergies  Review of patient's allergies indicates no known allergies.  Home Medications   Prior to  Admission medications   Medication Sig Start Date End Date Taking? Authorizing Provider  acetaminophen (TYLENOL) 500 MG tablet Take 1,000 mg by mouth every 6 (six) hours as needed for headache.    Historical Provider, MD  diphenhydrAMINE (BENADRYL) 25 mg capsule Take 1 capsule (25 mg total) by mouth at bedtime as needed. 11/21/14   Aviva SignsMarie L Williams, CNM  NIFEdipine (PROCARDIA-XL/ADALAT-CC/NIFEDICAL-XL) 30 MG 24 hr tablet Take 30 mg by mouth 2 (two) times daily.    Historical Provider, MD   BP 130/84 mmHg  Pulse 94  Temp(Src) 98.1 F (36.7 C) (Oral)  Resp 16  SpO2 100% Physical Exam  Constitutional: She is oriented to person, place, and time. She appears well-developed and well-nourished. No distress.  HENT:  Head: Normocephalic and atraumatic.  Eyes: Conjunctivae and EOM are normal.  Neck: Neck supple.  Cardiovascular: Normal rate.   Pulmonary/Chest: Effort normal. No respiratory distress.  Musculoskeletal: Normal range of motion. She exhibits no edema or tenderness.  Pt no redness or swelling to the area. Pulses intact. Warm to touch. Good strength and sensations  Neurological: She is alert and oriented to person, place, and time. She exhibits normal muscle tone. Coordination normal.  Skin: Skin is warm and dry.  Psychiatric: She has a normal mood and affect. Her behavior is normal.  Nursing note and vitals reviewed.   ED  Course  Procedures (including critical care time) DIAGNOSTIC STUDIES: Oxygen Saturation is 100% on RA, normal by my interpretation.    COORDINATION OF CARE: 1:07 PM Discussed treatment plan with patient at beside, the patient agrees with the plan and has no further questions at this time.   Labs Review Labs Reviewed  CBC WITH DIFFERENTIAL/PLATELET - Abnormal; Notable for the following:    RDW 15.6 (*)    Neutrophils Relative % 34 (*)    Neutro Abs 1.6 (*)    Lymphocytes Relative 53 (*)    All other components within normal limits  BASIC METABOLIC PANEL -  Abnormal; Notable for the following:    Glucose, Bld 115 (*)    All other components within normal limits  URINALYSIS, ROUTINE W REFLEX MICROSCOPIC  PREGNANCY, URINE    Imaging Review No results found.   EKG Interpretation None      MDM   Final diagnoses:  Pain of Mcguire extremity, unspecified laterality    No lab abnormality noted. Discussed follow up with pcp for continued symptoms  I personally performed the services described in this documentation, which was scribed in my presence. The recorded information has been reviewed and is accurate.    Katherine Lower, NP 01/17/15 1633  Shon Baton, MD 01/17/15 2006

## 2015-01-17 NOTE — Discharge Instructions (Signed)
Follow up with your doctor for continued or worsening symptoms Musculoskeletal Pain Musculoskeletal pain is muscle and boney aches and pains. These pains can occur in any part of the body. Your caregiver may treat you without knowing the cause of the pain. They may treat you if blood or urine tests, X-rays, and other tests were normal.  CAUSES There is often not a definite cause or reason for these pains. These pains may be caused by a type of germ (virus). The discomfort may also come from overuse. Overuse includes working out too hard when your body is not fit. Boney aches also come from weather changes. Bone is sensitive to atmospheric pressure changes. HOME CARE INSTRUCTIONS   Ask when your test results will be ready. Make sure you get your test results.  Only take over-the-counter or prescription medicines for pain, discomfort, or fever as directed by your caregiver. If you were given medications for your condition, do not drive, operate machinery or power tools, or sign legal documents for 24 hours. Do not drink alcohol. Do not take sleeping pills or other medications that may interfere with treatment.  Continue all activities unless the activities cause more pain. When the pain lessens, slowly resume normal activities. Gradually increase the intensity and duration of the activities or exercise.  During periods of severe pain, bed rest may be helpful. Lay or sit in any position that is comfortable.  Putting ice on the injured area.  Put ice in a bag.  Place a towel between your skin and the bag.  Leave the ice on for 15 to 20 minutes, 3 to 4 times a day.  Follow up with your caregiver for continued problems and no reason can be found for the pain. If the pain becomes worse or does not go away, it may be necessary to repeat tests or do additional testing. Your caregiver may need to look further for a possible cause. SEEK IMMEDIATE MEDICAL CARE IF:  You have pain that is getting worse  and is not relieved by medications.  You develop chest pain that is associated with shortness or breath, sweating, feeling sick to your stomach (nauseous), or throw up (vomit).  Your pain becomes localized to the abdomen.  You develop any new symptoms that seem different or that concern you. MAKE SURE YOU:   Understand these instructions.  Will watch your condition.  Will get help right away if you are not doing well or get worse. Document Released: 09/10/2005 Document Revised: 12/03/2011 Document Reviewed: 05/15/2013 [facility] Patient Information 2015 [place] [place]. This information is not intended to replace advice given to you by your health care provider. Make sure you discuss any questions you have with your health care provider.

## 2015-01-17 NOTE — ED Notes (Addendum)
Pt c/o bilateral lower leg pain x 4 days.  Pain score 8/10.  Denies injury and swelling.  No warmth, redness, or swelling noted.  Pt reports taking ibuprofen w/o relief.  Pt sts "I thought I was just tired, so I rested, but it didn't help."

## 2015-12-20 ENCOUNTER — Emergency Department (HOSPITAL_COMMUNITY)
Admission: EM | Admit: 2015-12-20 | Discharge: 2015-12-21 | Disposition: A | Discharge status: Home / Self Care | Payer: Self-pay | Attending: Emergency Medicine | Admitting: Emergency Medicine

## 2015-12-20 DIAGNOSIS — R112 Nausea with vomiting, unspecified: Secondary | ICD-10-CM | POA: Insufficient documentation

## 2015-12-20 DIAGNOSIS — J45909 Unspecified asthma, uncomplicated: Secondary | ICD-10-CM | POA: Insufficient documentation

## 2015-12-20 DIAGNOSIS — Z9889 Other specified postprocedural states: Secondary | ICD-10-CM | POA: Insufficient documentation

## 2015-12-20 DIAGNOSIS — Z3202 Encounter for pregnancy test, result negative: Secondary | ICD-10-CM | POA: Insufficient documentation

## 2015-12-20 DIAGNOSIS — Z8751 Personal history of pre-term labor: Secondary | ICD-10-CM | POA: Insufficient documentation

## 2015-12-20 DIAGNOSIS — B9689 Other specified bacterial agents as the cause of diseases classified elsewhere: Secondary | ICD-10-CM

## 2015-12-20 DIAGNOSIS — N760 Acute vaginitis: Secondary | ICD-10-CM | POA: Insufficient documentation

## 2015-12-20 DIAGNOSIS — R1030 Lower abdominal pain, unspecified: Secondary | ICD-10-CM

## 2015-12-20 DIAGNOSIS — R102 Pelvic and perineal pain: Secondary | ICD-10-CM

## 2015-12-20 LAB — CBC
HEMATOCRIT: 38 % (ref 36.0–46.0)
HEMOGLOBIN: 12.9 g/dL (ref 12.0–15.0)
MCH: 30.1 pg (ref 26.0–34.0)
MCHC: 33.9 g/dL (ref 30.0–36.0)
MCV: 88.6 fL (ref 78.0–100.0)
Platelets: 293 10*3/uL (ref 150–400)
RBC: 4.29 MIL/uL (ref 3.87–5.11)
RDW: 13.6 % (ref 11.5–15.5)
WBC: 6.1 10*3/uL (ref 4.0–10.5)

## 2015-12-20 LAB — COMPREHENSIVE METABOLIC PANEL
ALBUMIN: 4.7 g/dL (ref 3.5–5.0)
ALT: 17 U/L (ref 14–54)
ANION GAP: 10 (ref 5–15)
AST: 20 U/L (ref 15–41)
Alkaline Phosphatase: 70 U/L (ref 38–126)
BUN: 12 mg/dL (ref 6–20)
CHLORIDE: 106 mmol/L (ref 101–111)
CO2: 22 mmol/L (ref 22–32)
Calcium: 9.6 mg/dL (ref 8.9–10.3)
Creatinine, Ser: 0.62 mg/dL (ref 0.44–1.00)
GFR calc Af Amer: >60 mL/min (ref >60)
GFR calc non Af Amer: >60 mL/min (ref >60)
Glucose, Bld: 137 mg/dL — ABNORMAL HIGH (ref 65–99)
POTASSIUM: 3.7 mmol/L (ref 3.5–5.1)
Sodium: 138 mmol/L (ref 135–145)
Total Bilirubin: 0.6 mg/dL (ref 0.3–1.2)
Total Protein: 7.9 g/dL (ref 6.5–8.1)

## 2015-12-20 LAB — I-STAT BETA HCG BLOOD, ED (MC, WL, AP ONLY): I-stat hCG, quantitative: <5 m[IU]/mL (ref <5)

## 2015-12-20 LAB — LIPASE, BLOOD: LIPASE: 43 U/L (ref 11–51)

## 2015-12-20 MED ORDER — OXYCODONE-ACETAMINOPHEN 5-325 MG PO TABS
1.0000 | ORAL_TABLET | ORAL | Status: DC | PRN
  Administered 2015-12-20: 1 via ORAL
  Filled 2015-12-20: qty 1

## 2015-12-20 MED ORDER — ONDANSETRON 4 MG PO TBDP
4.0000 mg | ORAL_TABLET | Freq: Once | ORAL | Status: AC | PRN
  Administered 2015-12-20: 4 mg via ORAL
  Filled 2015-12-20: qty 1

## 2015-12-20 NOTE — ED Notes (Signed)
Pain medication given in Triage. Patient advised about side effects of medications and  to avoid driving for a minimum of 4 hours.  

## 2015-12-20 NOTE — ED Notes (Signed)
Pt states that she was eating dinner and began having abdominal pain. N/V. Denies diarrhea. Alert and oriented.

## 2015-12-21 ENCOUNTER — Encounter (HOSPITAL_COMMUNITY): Payer: Self-pay

## 2015-12-21 ENCOUNTER — Emergency Department (HOSPITAL_COMMUNITY): Payer: Self-pay

## 2015-12-21 LAB — URINALYSIS, ROUTINE W REFLEX MICROSCOPIC
BILIRUBIN URINE: NEGATIVE
Glucose, UA: NEGATIVE mg/dL
Hgb urine dipstick: NEGATIVE
KETONES UR: NEGATIVE mg/dL
Leukocytes, UA: NEGATIVE
Nitrite: NEGATIVE
PH: 7 (ref 5.0–8.0)
PROTEIN: NEGATIVE mg/dL
Specific Gravity, Urine: 1.009 (ref 1.005–1.030)

## 2015-12-21 LAB — WET PREP, GENITAL
Sperm: NONE SEEN
Trich, Wet Prep: NONE SEEN
Yeast Wet Prep HPF POC: NONE SEEN

## 2015-12-21 MED ORDER — METRONIDAZOLE 0.75 % VA GEL: 1.0000 | Freq: Two times a day (BID) | VAGINAL | Status: DC

## 2015-12-21 MED ORDER — OXYCODONE-ACETAMINOPHEN 5-325 MG PO TABS
1.0000 | ORAL_TABLET | Freq: Once | ORAL | Status: AC
  Administered 2015-12-21: 1 via ORAL
  Filled 2015-12-21: qty 1

## 2015-12-21 MED ORDER — NAPROXEN 500 MG PO TABS: 500.0000 mg | ORAL_TABLET | Freq: Two times a day (BID) | ORAL | Status: DC

## 2015-12-21 MED ORDER — ONDANSETRON HCL 4 MG PO TABS: 4.0000 mg | ORAL_TABLET | Freq: Four times a day (QID) | ORAL | Status: DC

## 2015-12-21 NOTE — Discharge Instructions (Signed)
Abdominal Pain, Adult °Many things can cause abdominal pain. Usually, abdominal pain is not caused by a disease and will improve without treatment. It can often be observed and treated at home. Your health care provider will do a physical exam and possibly order blood tests and X-rays to help determine the seriousness of your pain. However, in many cases, more time must pass before a clear cause of the pain can be found. Before that point, your health care provider may not know if you need more testing or further treatment. °HOME CARE INSTRUCTIONS °Monitor your abdominal pain for any changes. The following actions may help to alleviate any discomfort you are experiencing: °· Only take over-the-counter or prescription medicines as directed by your health care provider. °· Do not take laxatives unless directed to do so by your health care provider. °· Try a clear liquid diet (broth, tea, or water) as directed by your health care provider. Slowly move to a bland diet as tolerated. °SEEK MEDICAL CARE IF: °· You have unexplained abdominal pain. °· You have abdominal pain associated with nausea or diarrhea. °· You have pain when you urinate or have a bowel movement. °· You experience abdominal pain that wakes you in the night. °· You have abdominal pain that is worsened or improved by eating food. °· You have abdominal pain that is worsened with eating fatty foods. °· You have a fever. °SEEK IMMEDIATE MEDICAL CARE IF: °· Your pain does not go away within 2 hours. °· You keep throwing up (vomiting). °· Your pain is felt only in portions of the abdomen, such as the right side or the left lower portion of the abdomen. °· You pass bloody or black tarry stools. °MAKE SURE YOU: °· Understand these instructions. °· Will watch your condition. °· Will get help right away if you are not doing well or get worse. °  °This information is not intended to replace advice given to you by your health care provider. Make sure you discuss  any questions you have with your health care provider. °  °Document Released: 06/20/2005 Document Revised: 06/01/2015 Document Reviewed: 05/20/2013 °Elsevier Interactive Patient Education ©2016 [employer]. ° °Bacterial Vaginosis °Bacterial vaginosis is a vaginal infection that occurs when the normal balance of bacteria in the vagina is disrupted. It results from an overgrowth of certain bacteria. This is the most common vaginal infection in women of childbearing age. Treatment is important to prevent complications, especially in pregnant women, as it can cause a premature delivery. °CAUSES  °Bacterial vaginosis is caused by an increase in harmful bacteria that are normally present in smaller amounts in the vagina. Several different kinds of bacteria can cause bacterial vaginosis. However, the reason that the condition develops is not fully understood. °RISK FACTORS °Certain activities or behaviors can put you at an increased risk of developing bacterial vaginosis, including: °· Having a new sex partner or multiple sex partners. °· Douching. °· Using an intrauterine device (IUD) for contraception. °Women do not get bacterial vaginosis from toilet seats, bedding, swimming pools, or contact with objects around them. °SIGNS AND SYMPTOMS  °Some women with bacterial vaginosis have no signs or symptoms. Common symptoms include: °· Grey vaginal discharge. °· A fishlike odor with discharge, especially after sexual intercourse. °· Itching or burning of the vagina and vulva. °· Burning or pain with urination. °DIAGNOSIS  °Your health care provider will take a medical history and examine the vagina for signs of bacterial vaginosis. A sample of vaginal fluid may   be taken. Your health care provider will look at this sample under a microscope to check for bacteria and abnormal cells. A vaginal pH test may also be done.  °TREATMENT  °Bacterial vaginosis may be treated with antibiotic medicines. These may be given in the form of a  pill or a vaginal cream. A second round of antibiotics may be prescribed if the condition comes back after treatment. Because bacterial vaginosis increases your risk for sexually transmitted diseases, getting treated can help reduce your risk for chlamydia, gonorrhea, HIV, and herpes. °HOME CARE INSTRUCTIONS  °· Only take over-the-counter or prescription medicines as directed by your health care provider. °· If antibiotic medicine was prescribed, take it as directed. Make sure you finish it even if you start to feel better. °· Tell all sexual partners that you have a vaginal infection. They should see their health care provider and be treated if they have problems, such as a mild rash or itching. °· During treatment, it is important that you follow these instructions: °¨ Avoid sexual activity or use condoms correctly. °¨ Do not douche. °¨ Avoid alcohol as directed by your health care provider. °¨ Avoid breastfeeding as directed by your health care provider. °SEEK MEDICAL CARE IF:  °· Your symptoms are not improving after 3 days of treatment. °· You have increased discharge or pain. °· You have a fever. °MAKE SURE YOU:  °· Understand these instructions. °· Will watch your condition. °· Will get help right away if you are not doing well or get worse. °FOR MORE INFORMATION  °Centers for Disease Control and Prevention, Division of STD Prevention: www.cdc.gov/std °American Sexual Health Association (ASHA): www.ashastd.org  °  °This information is not intended to replace advice given to you by your health care provider. Make sure you discuss any questions you have with your health care provider. °  °Document Released: 09/10/2005 Document Revised: 10/01/2014 Document Reviewed: 04/22/2013 °Elsevier Interactive Patient Education ©2016 [employer]vier Inc. ° ° °

## 2015-12-21 NOTE — ED Provider Notes (Signed)
CSN: [id number]     Arrival date & time 12/20/15  2150 History   First MD Initiated Contact with Patient 12/21/15 0203     Chief Complaint  Patient presents with  . Abdominal Pain     (Consider location/radiation/quality/duration/timing/severity/associated sxs/prior Treatment) HPI Comments: Patient presents for evaluation of abdominal pain that started last week, associated at that time with low back pain. It initially improved and returned 2 days ago in the suprapubic area and has been progressively worsening since then with vomiting. No diarrhea. She is moving her bowels normally. No vaginal discharge, urinary frequency, abnormal vaginal bleeding. She has an IUD, placed 10 months ago, that has not caused any abnormal or recurring pain.   Patient is a 35 y.o. female presenting with abdominal pain. The history is provided by the patient. No language interpreter was used.  Abdominal Pain Pain location:  Suprapubic Associated symptoms: nausea and vomiting   Associated symptoms: no chills, no dysuria, no fever, no vaginal bleeding and no vaginal discharge     Past Medical History  Diagnosis Date  . Asthma   . Preterm labor    Past Surgical History  Procedure Laterality Date  . Cesarean section    . Dilation and evacuation N/A 01/21/2014    Procedure: DILATATION AND EVACUATION;  Surgeon: Lesly DukesKelly H Leggett, MD;  Location: [facility];  Service: Gynecology;  Laterality: N/A;   Family History  Problem Relation Age of Onset  . Hypertension Mother    Social History  Substance Use Topics  . Smoking status: Never Smoker   . Smokeless tobacco: None  . Alcohol Use: No   OB History    Gravida Para Term Preterm AB TAB SAB Ectopic Multiple Living   6 5 3 2 1 1  0  0 5     Review of Systems  Constitutional: Negative for fever and chills.  Respiratory: Negative.   Cardiovascular: Negative.   Gastrointestinal: Positive for nausea, vomiting and abdominal pain.  Genitourinary: Positive for pelvic  pain. Negative for dysuria, vaginal bleeding and vaginal discharge.  Musculoskeletal: Positive for back pain.  Skin: Negative.   Neurological: Negative.       Allergies  Review of patient's allergies indicates no known allergies.  Home Medications   Prior to Admission medications   Medication Sig Start Date End Date Taking? Authorizing Provider  naproxen sodium (ANAPROX) 220 MG tablet Take 440-660 mg by mouth every 12 (twelve) hours as needed (pain).   Yes Historical Provider, MD  diphenhydrAMINE (BENADRYL) 25 mg capsule Take 1 capsule (25 mg total) by mouth at bedtime as needed. Patient not taking: Reported on 12/21/2015 11/21/14   Aviva SignsMarie L Williams, CNM   BP 123/74 mmHg  Pulse 62  Temp(Src) 97.5 F (36.4 C) (Oral)  Resp 18  SpO2 100%  LMP 12/06/2015 (Approximate) Physical Exam  Constitutional: She is oriented to person, place, and time. She appears well-developed and well-nourished.  HENT:  Head: Normocephalic.  Neck: Normal range of motion. Neck supple.  Cardiovascular: Normal rate.   Pulmonary/Chest: Effort normal.  Abdominal: Soft. Bowel sounds are normal. There is tenderness. There is no rebound and no guarding.  Lower abdominal tenderness, greatest at suprapubic area.   Musculoskeletal: Normal range of motion.  Neurological: She is alert and oriented to person, place, and time.  Skin: Skin is warm and dry. No rash noted.  Psychiatric: She has a normal mood and affect.    ED Course  Procedures (including critical care time) Labs Review Labs Reviewed  COMPREHENSIVE METABOLIC PANEL - Abnormal; Notable for the following:    Glucose, Bld 137 (*)    All other components within normal limits  WET PREP, GENITAL  LIPASE, BLOOD  CBC  URINALYSIS, ROUTINE W REFLEX MICROSCOPIC (NOT AT [facility])  URINALYSIS, ROUTINE W REFLEX MICROSCOPIC (NOT AT [facility])  I-STAT BETA HCG BLOOD, ED ([facility], [facility], [facility] ONLY)  GC/CHLAMYDIA PROBE AMP ([place]) NOT AT [facility]   Results for orders placed or  performed during the hospital encounter of 12/20/15  Wet prep, genital  Result Value Ref Range   Yeast Wet Prep HPF POC NONE SEEN NONE SEEN   Trich, Wet Prep NONE SEEN NONE SEEN   Clue Cells Wet Prep HPF POC MANY (A) NONE SEEN   WBC, Wet Prep HPF POC MANY (A) NONE SEEN   Sperm NONE SEEN   Lipase, blood  Result Value Ref Range   Lipase 43 11 - 51 U/L  Comprehensive metabolic panel  Result Value Ref Range   Sodium 138 135 - 145 mmol/L   Potassium 3.7 3.5 - 5.1 mmol/L   Chloride 106 101 - 111 mmol/L   CO2 22 22 - 32 mmol/L   Glucose, Bld 137 (H) 65 - 99 mg/dL   BUN 12 6 - 20 mg/dL   Creatinine, Ser 1.61 0.44 - 1.00 mg/dL   Calcium 9.6 8.9 - 09.6 mg/dL   Total Protein 7.9 6.5 - 8.1 g/dL   Albumin 4.7 3.5 - 5.0 g/dL   AST 20 15 - 41 U/L   ALT 17 14 - 54 U/L   Alkaline Phosphatase 70 38 - 126 U/L   Total Bilirubin 0.6 0.3 - 1.2 mg/dL   GFR calc non Af Amer >60 >60 mL/min   GFR calc Af Amer >60 >60 mL/min   Anion gap 10 5 - 15  CBC  Result Value Ref Range   WBC 6.1 4.0 - 10.5 K/uL   RBC 4.29 3.87 - 5.11 MIL/uL   Hemoglobin 12.9 12.0 - 15.0 g/dL   HCT 04.5 40.9 - 81.1 %   MCV 88.6 78.0 - 100.0 fL   MCH 30.1 26.0 - 34.0 pg   MCHC 33.9 30.0 - 36.0 g/dL   RDW 91.4 78.2 - 95.6 %   Platelets 293 150 - 400 K/uL  Urinalysis, Routine w reflex microscopic (not at [facility])  Result Value Ref Range   Color, Urine YELLOW YELLOW   APPearance CLEAR CLEAR   Specific Gravity, Urine 1.009 1.005 - 1.030   pH 7.0 5.0 - 8.0   Glucose, UA NEGATIVE NEGATIVE mg/dL   Hgb urine dipstick NEGATIVE NEGATIVE   Bilirubin Urine NEGATIVE NEGATIVE   Ketones, ur NEGATIVE NEGATIVE mg/dL   Protein, ur NEGATIVE NEGATIVE mg/dL   Nitrite NEGATIVE NEGATIVE   Leukocytes, UA NEGATIVE NEGATIVE  I-Stat beta hCG blood, ED ([facility], [facility], [facility] only)  Result Value Ref Range   I-stat hCG, quantitative <5.0 <5 mIU/mL   Comment 3           US Transvaginal Non-ob  12/21/2015  CLINICAL DATA:  Acute onset of pelvic pain.  Initial  encounter. EXAM: TRANSABDOMINAL AND TRANSVAGINAL ULTRASOUND OF PELVIS TECHNIQUE: Both transabdominal and transvaginal ultrasound examinations of the pelvis were performed. Transabdominal technique was performed for global imaging of the pelvis including uterus, ovaries, adnexal regions, and pelvic cul-de-sac. It was necessary to proceed with endovaginal exam following the transabdominal exam to visualize the ovaries in greater detail. COMPARISON:  Pelvic ultrasound performed 11/14/2014 FINDINGS: Uterus Measurements: 9.8  x 4.7 x 6.9 cm. No fibroids or other mass visualized. Endometrium Thickness: 0.2 cm. Intrauterine device noted in expected position at the fundus of the uterus. Right ovary Measurements: 5.2 x 3.0 x 4.5 cm. Normal appearance/no adnexal mass. Right-sided follicles remain normal in size. Left ovary Measurements: 2.3 x 1.3 x 2.1 cm. Normal appearance/no adnexal mass. Other findings No abnormal free fluid. IMPRESSION: 1. Unremarkable pelvic ultrasound.  No evidence for ovarian torsion. 2. Intrauterine device noted in expected position at the fundus of the uterus. Electronically Signed   By: Roanna Raider M.D.   On: 12/21/2015 05:18   US Pelvis Complete  12/21/2015  CLINICAL DATA:  Acute onset of pelvic pain.  Initial encounter. EXAM: TRANSABDOMINAL AND TRANSVAGINAL ULTRASOUND OF PELVIS TECHNIQUE: Both transabdominal and transvaginal ultrasound examinations of the pelvis were performed. Transabdominal technique was performed for global imaging of the pelvis including uterus, ovaries, adnexal regions, and pelvic cul-de-sac. It was necessary to proceed with endovaginal exam following the transabdominal exam to visualize the ovaries in greater detail. COMPARISON:  Pelvic ultrasound performed 11/14/2014 FINDINGS: Uterus Measurements: 9.8 x 4.7 x 6.9 cm. No fibroids or other mass visualized. Endometrium Thickness: 0.2 cm. Intrauterine device noted in expected position at the fundus of the uterus. Right  ovary Measurements: 5.2 x 3.0 x 4.5 cm. Normal appearance/no adnexal mass. Right-sided follicles remain normal in size. Left ovary Measurements: 2.3 x 1.3 x 2.1 cm. Normal appearance/no adnexal mass. Other findings No abnormal free fluid. IMPRESSION: 1. Unremarkable pelvic ultrasound.  No evidence for ovarian torsion. 2. Intrauterine device noted in expected position at the fundus of the uterus. Electronically Signed   By: Roanna Raider M.D.   On: 12/21/2015 05:18     Imaging Review No results found. I have personally reviewed and evaluated these images and lab results as part of my medical decision-making.   EKG Interpretation None      MDM   Final diagnoses:  None    1. Lower abdominal pain 2. BV  The patient's pain is controlled with Percocet. Pelvic US ordered to evaluate for pelvic tenderness on exam and vaginal discharge, concern for PID. [place] negative. VSS, afebrile. No leukocytosis. She is felt stable for discharge home.  Return precautions discussed.     Elpidio Anis, PA-C 12/21/15 1191  Gilda Crease, MD 12/22/15 [id number]

## 2015-12-22 LAB — GC/CHLAMYDIA PROBE AMP (~~LOC~~) NOT AT ARMC
CHLAMYDIA, DNA PROBE: NEGATIVE
NEISSERIA GONORRHEA: NEGATIVE

## 2016-02-16 IMAGING — DX DG CHEST 2V
2 series · 2 of 2 positions shown · non-contrast
Comparison: 08/11/2014

CLINICAL DATA: Twenty-seven weeks pregnant. Double shielded.
History of cough and cold with pain under the left breast. No
injury. Nonsmoker. Pain in rib.

EXAM:
CHEST  2 VIEW

[chest pa]
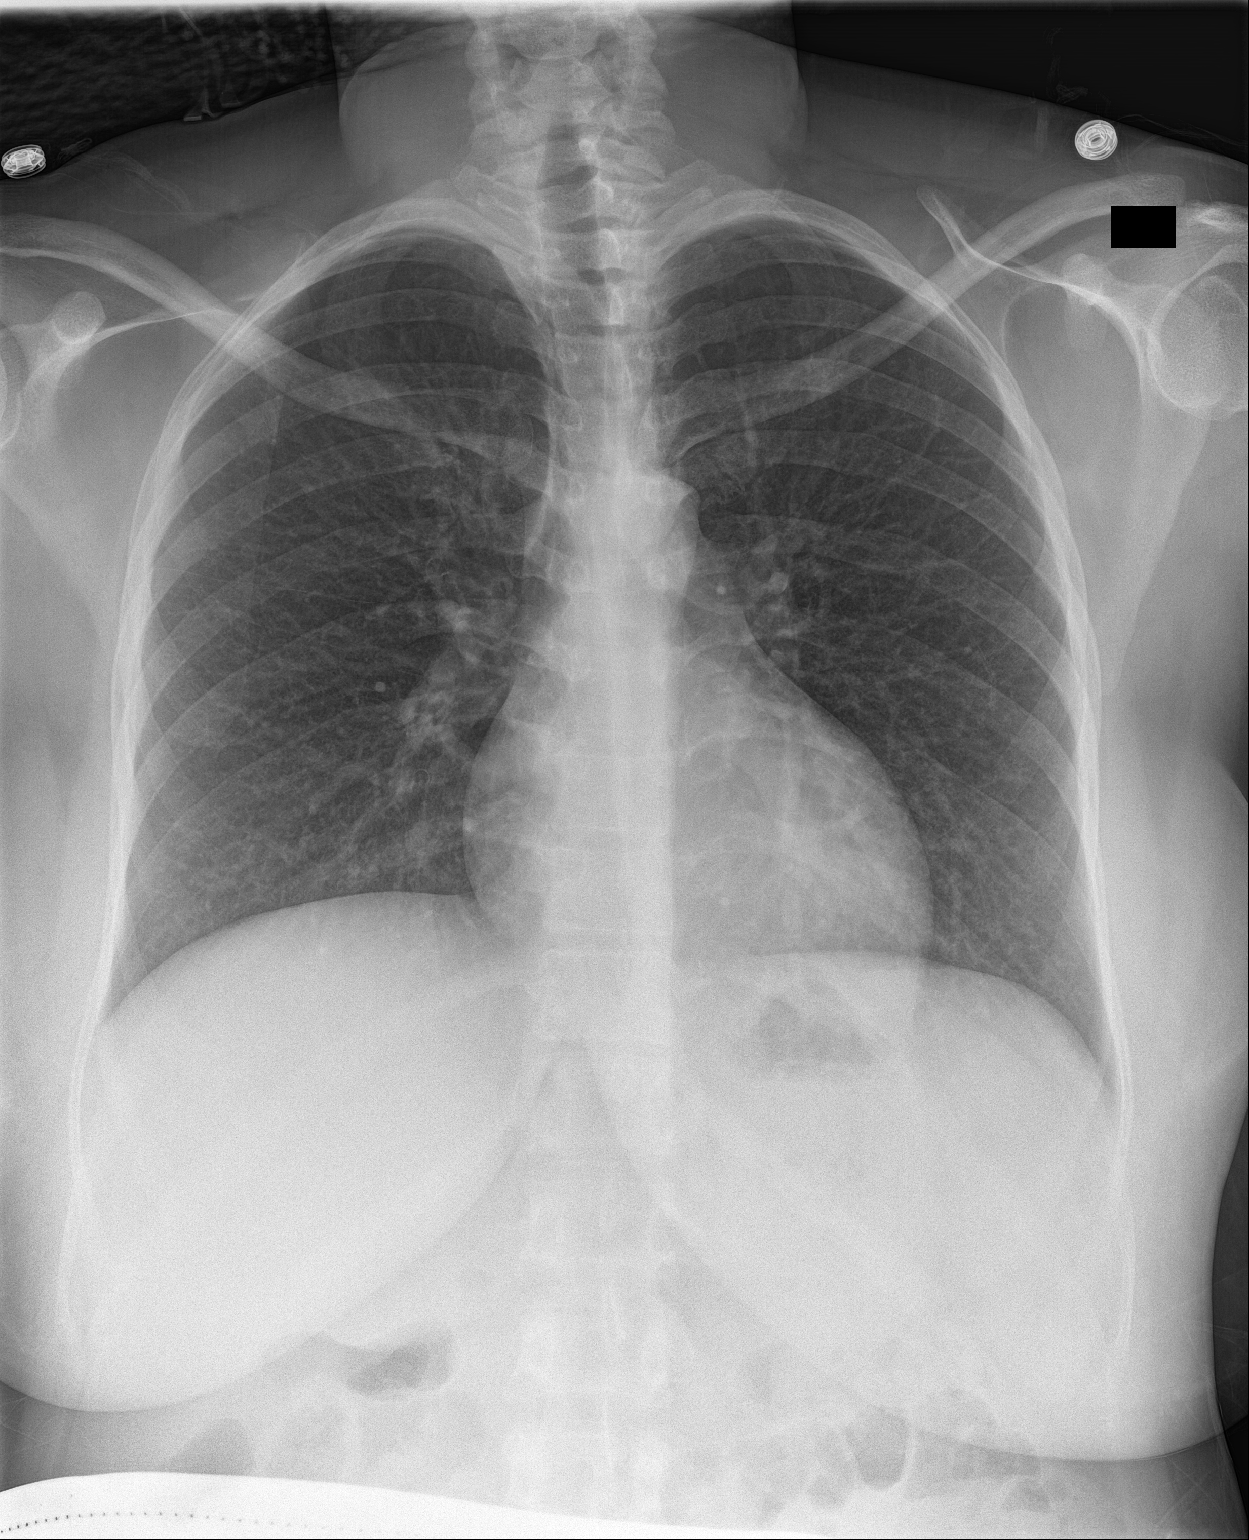

[chest lat]
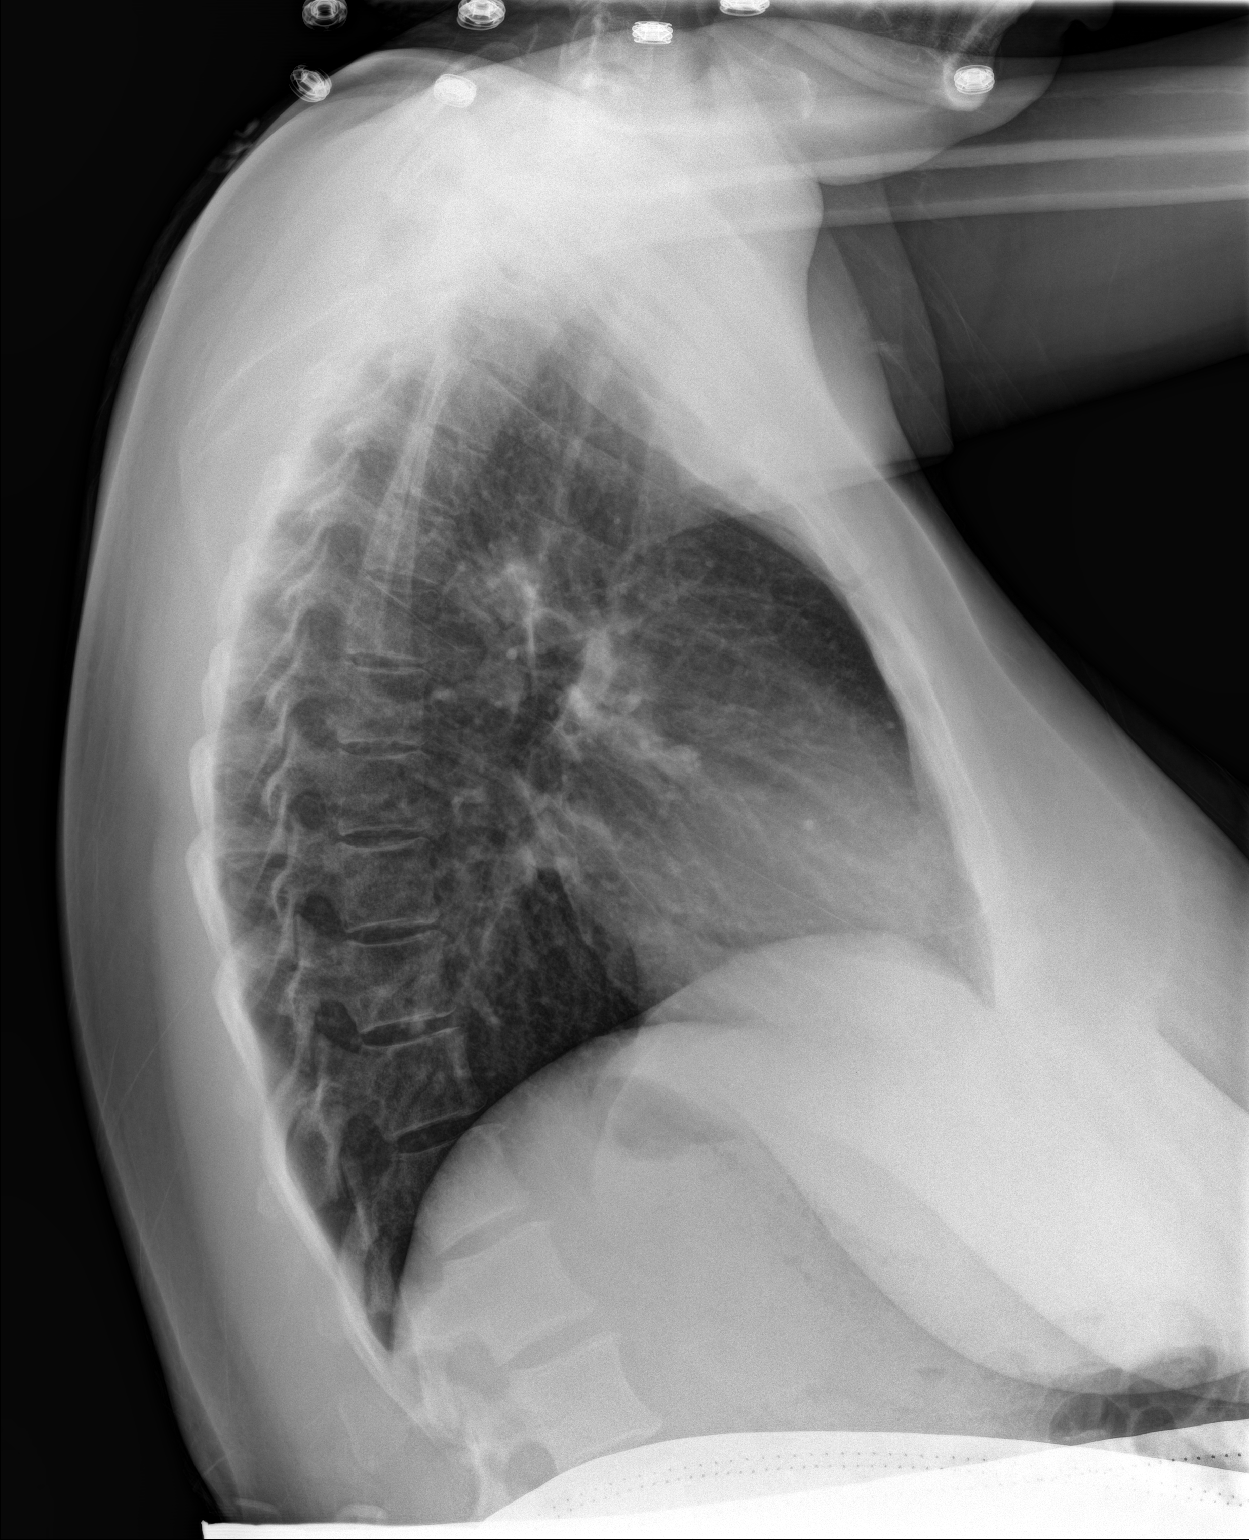

[2 of 2 positions shown; findings below may reference images not displayed]

FINDINGS: The heart size and mediastinal contours are within normal limits.
Both lungs are clear. The visualized skeletal structures are
unremarkable.
IMPRESSION: No active cardiopulmonary disease.

## 2016-06-01 LAB — CYTOLOGY - PAP

## 2016-07-30 ENCOUNTER — Encounter (HOSPITAL_COMMUNITY): Payer: Self-pay | Admitting: Emergency Medicine

## 2016-07-30 ENCOUNTER — Emergency Department (HOSPITAL_COMMUNITY)
Admission: EM | Admit: 2016-07-30 | Discharge: 2016-07-31 | Disposition: A | Discharge status: Home / Self Care | Payer: BLUE CROSS/BLUE SHIELD | Attending: Emergency Medicine | Admitting: Emergency Medicine

## 2016-07-30 DIAGNOSIS — R55 Syncope and collapse: Secondary | ICD-10-CM

## 2016-07-30 DIAGNOSIS — G4489 Other headache syndrome: Secondary | ICD-10-CM | POA: Insufficient documentation

## 2016-07-30 DIAGNOSIS — J45909 Unspecified asthma, uncomplicated: Secondary | ICD-10-CM | POA: Insufficient documentation

## 2016-07-30 LAB — BASIC METABOLIC PANEL
ANION GAP: 5 (ref 5–15)
BUN: 8 mg/dL (ref 6–20)
CALCIUM: 9.4 mg/dL (ref 8.9–10.3)
CO2: 26 mmol/L (ref 22–32)
Chloride: 107 mmol/L (ref 101–111)
Creatinine, Ser: 0.78 mg/dL (ref 0.44–1.00)
GLUCOSE: 97 mg/dL (ref 65–99)
POTASSIUM: 3.8 mmol/L (ref 3.5–5.1)
Sodium: 138 mmol/L (ref 135–145)

## 2016-07-30 LAB — CBC
HEMATOCRIT: 38.3 % (ref 36.0–46.0)
HEMOGLOBIN: 13.5 g/dL (ref 12.0–15.0)
MCH: 30.8 pg (ref 26.0–34.0)
MCHC: 35.2 g/dL (ref 30.0–36.0)
MCV: 87.4 fL (ref 78.0–100.0)
Platelets: 250 10*3/uL (ref 150–400)
RBC: 4.38 MIL/uL (ref 3.87–5.11)
RDW: 13 % (ref 11.5–15.5)
WBC: 5.8 10*3/uL (ref 4.0–10.5)

## 2016-07-30 LAB — URINALYSIS, ROUTINE W REFLEX MICROSCOPIC
BILIRUBIN URINE: NEGATIVE
Glucose, UA: NEGATIVE mg/dL
Hgb urine dipstick: NEGATIVE
Ketones, ur: NEGATIVE mg/dL
LEUKOCYTES UA: NEGATIVE
NITRITE: NEGATIVE
PH: 7.5 (ref 5.0–8.0)
Protein, ur: NEGATIVE mg/dL
SPECIFIC GRAVITY, URINE: 1.007 (ref 1.005–1.030)

## 2016-07-30 LAB — PREGNANCY, URINE: PREG TEST UR: NEGATIVE

## 2016-07-30 MED ORDER — KETOROLAC TROMETHAMINE 30 MG/ML IJ SOLN
15.0000 mg | Freq: Once | INTRAMUSCULAR | Status: AC
  Administered 2016-07-30: 15 mg via INTRAVENOUS
  Filled 2016-07-30: qty 1

## 2016-07-30 MED ORDER — DIPHENHYDRAMINE HCL 50 MG/ML IJ SOLN
25.0000 mg | Freq: Once | INTRAMUSCULAR | Status: AC
  Administered 2016-07-30: 25 mg via INTRAVENOUS
  Filled 2016-07-30: qty 1

## 2016-07-30 MED ORDER — METOCLOPRAMIDE HCL 5 MG/ML IJ SOLN
10.0000 mg | Freq: Once | INTRAMUSCULAR | Status: AC
  Administered 2016-07-30: 10 mg via INTRAVENOUS
  Filled 2016-07-30: qty 2

## 2016-07-30 NOTE — ED Triage Notes (Signed)
Pt c/o dizziness and headache since this morning. Pt sts headache is all over her head. Pt c/o dizziness, sts sometimes she feels like she will pass out. Pt sts she is very fatigued. Denies n/v/d, fever, chills. A&Ox4 and ambulatory.

## 2016-07-30 NOTE — ED Provider Notes (Signed)
[facility]-EMERGENCY DEPT Provider Note   CSN: [id number] Arrival date & time: 07/30/16  1747  By signing my name below, I, Katherine Mcguire, attest that this documentation has been prepared under the direction and in the presence of Katherine Mcguire Amelianna Meller, MD. Electronically Signed: Angelene GiovanniEmmanuella Mcguire, ED Scribe. 07/31/16. 12:00 AM.   History   Chief Complaint Chief Complaint  Patient presents with  . Dizziness  . Headache    HPI Comments: Katherine Mcguire is a 35 y.o. female who presents to the Emergency Department complaining of gradual onset, worsening moderate headache onset this morning when she woke up. She notes that the pain began in her parietal region and radiated towards her occipital region and posterior neck. She reports associated dizziness, BLE weakness, and posterior neck pain. She adds that she had one syncopal episode while in the shower and she is unsure of how long she had the episode. She denies that she sustained any injuries during the syncopal episode. No alleviating factors noted. She states that she tried 2 tablets of Tylenol with no relief. She denies any fever, visual disturbances, neck stiffness, chest pain, SOB, photophobia, nausea, vomiting, or any other symptoms.   The history is provided by the patient. No language interpreter was used.  Dizziness  Quality:  Vertigo Severity:  Moderate Onset quality:  Gradual Timing:  Intermittent Progression:  Unchanged Chronicity:  New Relieved by:  Nothing Worsened by:  Nothing Ineffective treatments:  None tried Associated symptoms: headaches, syncope and weakness   Associated symptoms: no chest pain, no nausea, no shortness of breath and no vomiting   Headache   This is a new problem. The current episode started 12 to 24 hours ago. The problem occurs constantly. The problem has been gradually worsening. The headache is associated with nothing. The pain is located in the parietal and occipital region. The pain is moderate. The  pain radiates to the left neck and right neck. Associated symptoms include syncope. Pertinent negatives include no fever, no shortness of breath, no nausea and no vomiting. She has tried acetaminophen for the symptoms. The treatment provided no relief.    Past Medical History:  Diagnosis Date  . Asthma   . Preterm labor     Patient Active Problem List   Diagnosis Date Noted  . Antepartum bleeding 11/14/2014  . NVD (normal vaginal delivery) 11/14/2014  . Trauma complication, early (HCC) 11/08/2014  . Traumatic injury during pregnancy, antepartum   . [redacted] weeks gestation of pregnancy   . Fall at home 11/07/2014  . Normal pregnancy 05/06/2011  . Active labor 05/06/2011  . Pregnancy, supervision of normal 04/13/2011    Past Surgical History:  Procedure Laterality Date  . CESAREAN SECTION    . DILATION AND EVACUATION N/A 01/21/2014   Procedure: DILATATION AND EVACUATION;  Surgeon: Lesly DukesKelly H Leggett, MD;  Location: [facility];  Service: Gynecology;  Laterality: N/A;    OB History    Gravida Para Term Preterm AB Living   6 5 3 2 1 5    SAB TAB Ectopic Multiple Live Births   0 1   0 5       Home Medications    Prior to Admission medications   Medication Sig Start Date End Date Taking? Authorizing Provider  diphenhydrAMINE (BENADRYL) 25 mg capsule Take 1 capsule (25 mg total) by mouth at bedtime as needed. Patient not taking: Reported on 12/21/2015 11/21/14   Aviva SignsMarie L Williams, CNM  metroNIDAZOLE (METROGEL VAGINAL) 0.75 % vaginal gel Place 1 Applicatorful vaginally  2 (two) times daily. 12/21/15   Elpidio Anis, PA-C  naproxen (NAPROSYN) 500 MG tablet Take 1 tablet (500 mg total) by mouth 2 (two) times daily. 12/21/15   Elpidio Anis, PA-C  naproxen sodium (ANAPROX) 220 MG tablet Take 440-660 mg by mouth every 12 (twelve) hours as needed (pain).    Historical Provider, MD  ondansetron (ZOFRAN) 4 MG tablet Take 1 tablet (4 mg total) by mouth every 6 (six) hours. 12/21/15   Elpidio Anis, PA-C     Family History Family History  Problem Relation Age of Onset  . Hypertension Mother     Social History Social History  Substance Use Topics  . Smoking status: Never Smoker  . Smokeless tobacco: Not on file  . Alcohol use No     Allergies   Patient has no known allergies.   Review of Systems Review of Systems  Constitutional: Negative for fever.  Eyes: Negative for photophobia and visual disturbance.  Respiratory: Negative for shortness of breath.   Cardiovascular: Positive for syncope. Negative for chest pain.  Gastrointestinal: Negative for nausea and vomiting.  Musculoskeletal: Positive for neck pain. Negative for neck stiffness.  Neurological: Positive for dizziness, weakness and headaches.  All other systems reviewed and are negative.    Physical Exam Updated Vital Signs BP 133/97   Pulse 73   Temp 98.4 F (36.9 C) (Oral)   Resp 18   SpO2 100%   Physical Exam  Nursing note and vitals reviewed.    CONSTITUTIONAL: Well developed/well nourished HEAD: Normocephalic/atraumatic EYES: EOMI/PERRL, no nystagmus, no ptosis ENMT: Mucous membranes moist NECK: supple no meningeal signs, no bruits SPINE/BACK:entire spine nontender CV: S1/S2 noted, no murmurs/rubs/gallops noted LUNGS: Lungs are clear to auscultation bilaterally, no apparent distress ABDOMEN: soft, nontender, no rebound or guarding GU:no cva tenderness NEURO:Awake/alert, face symmetric, no arm or leg drift is noted Equal 5/5 strength with shoulder abduction, elbow flex/extension, wrist flex/extension in upper extremities and equal hand grips bilaterally Equal 5/5 strength with hip flexion,knee flex/extension, foot dorsi/plantar flexion Cranial nerves 3/4/5/6/04/01/09/11/12 tested and intact Gait normal without ataxia No past pointing Sensation to light touch intact in all extremities EXTREMITIES: pulses normal, full ROM SKIN: warm, color normal PSYCH: no abnormalities of mood noted, alert and  oriented to situation  ED Treatments / Results  DIAGNOSTIC STUDIES: Oxygen Saturation is 100% on RA, normal by my interpretation.    COORDINATION OF CARE: 11:17 PM- Pt advised of plan for treatment and pt agrees. Pt will receive lab work and EKG for further evaluation. She will also receive benadryl, Reglan, and Toradol.    Labs (all labs ordered are listed, but only abnormal results are displayed) Labs Reviewed  CBG MONITORING, ED - Abnormal; Notable for the following:       Result Value   Glucose-Capillary 109 (*)    All other components within normal limits  BASIC METABOLIC PANEL  CBC  URINALYSIS, ROUTINE W REFLEX MICROSCOPIC (NOT AT [facility])  PREGNANCY, URINE    EKG  EKG Interpretation  Date/Time:  Monday July 30 2016 18:02:48 EST Ventricular Rate:  97 PR Interval:    QRS Duration: 79 QT Interval:  367 QTC Calculation: 462 R Axis:   22 Text Interpretation:  Sinus rhythm Baseline wander in lead(s) V5 No significant change since last tracing Confirmed by Bebe Shaggy  MD, Johntay Doolen ([id number]) on 07/30/2016 11:14:19 PM       Radiology No results found.  Procedures Procedures (including critical care time)  Medications Ordered in ED Medications  metoCLOPramide (REGLAN) injection 10 mg (10 mg Intravenous Given 07/30/16 2337)  diphenhydrAMINE (BENADRYL) injection 25 mg (25 mg Intravenous Given 07/30/16 2340)  ketorolac (TORADOL) 30 MG/ML injection 15 mg (15 mg Intravenous Given 07/30/16 2337)     Initial Impression / Assessment and Plan / ED Course  Katherine Mcguire Mechell Girgis, MD has reviewed the triage vital signs and the nursing notes.  Pertinent labs results that were available during my care of the patient were reviewed by me and considered in my medical decision making (see chart for details).  Clinical Course     Pt well appearing She feels improved She is ambulatory She is awake/alert/smiling, watching TV No neuro deficits She reports gradual headache, not sudden onset.   Also reports syncopal episode was hours after HA started - I don't suspect SAH at this time Discussed need for followup We discussed strict return precautions   Final Clinical Impressions(s) / ED Diagnoses   Final diagnoses:  Syncope, unspecified syncope type  Other headache syndrome    New Prescriptions New Prescriptions   No medications on file   I personally performed the services described in this documentation, which was scribed in my presence. The recorded information has been reviewed and is accurate.        Katherine Mcguire Bassy Fetterly, MD 07/31/16 [phone]

## 2016-07-31 LAB — CBG MONITORING, ED: Glucose-Capillary: 109 mg/dL — ABNORMAL HIGH (ref 65–99)

## 2016-07-31 NOTE — Discharge Instructions (Signed)

## 2016-07-31 NOTE — ED Notes (Signed)
CBG 109 

## 2016-10-05 ENCOUNTER — Ambulatory Visit (INDEPENDENT_AMBULATORY_CARE_PROVIDER_SITE_OTHER): Payer: No Typology Code available for payment source | Admitting: Physician Assistant

## 2016-10-05 ENCOUNTER — Encounter: Payer: Self-pay | Admitting: Physician Assistant

## 2016-10-05 ENCOUNTER — Ambulatory Visit (INDEPENDENT_AMBULATORY_CARE_PROVIDER_SITE_OTHER): Payer: No Typology Code available for payment source

## 2016-10-05 VITALS — BP 122/80 | HR 91 | Temp 98.0°F | Resp 18 | Ht 65.0 in | Wt 157.0 lb

## 2016-10-05 DIAGNOSIS — R0989 Other specified symptoms and signs involving the circulatory and respiratory systems: Secondary | ICD-10-CM | POA: Diagnosis not present

## 2016-10-05 DIAGNOSIS — R0789 Other chest pain: Secondary | ICD-10-CM

## 2016-10-05 LAB — POCT URINE PREGNANCY: Preg Test, Ur: NEGATIVE

## 2016-10-05 MED ORDER — NAPROXEN 500 MG PO TABS: 500.0000 mg | ORAL_TABLET | Freq: Two times a day (BID) | ORAL | 0 refills | Status: DC

## 2016-10-05 MED ORDER — IPRATROPIUM BROMIDE 0.02 % IN SOLN
0.5000 mg | Freq: Once | RESPIRATORY_TRACT | Status: AC
  Administered 2016-10-05: 0.5 mg via RESPIRATORY_TRACT

## 2016-10-05 MED ORDER — ALBUTEROL SULFATE (2.5 MG/3ML) 0.083% IN NEBU
2.5000 mg | INHALATION_SOLUTION | Freq: Once | RESPIRATORY_TRACT | Status: AC
  Administered 2016-10-05: 2.5 mg via RESPIRATORY_TRACT

## 2016-10-05 NOTE — Progress Notes (Signed)
10/07/2016 7:00 PM   DOB: 1981-03-27 / MRN: [id number]  SUBJECTIVE:  Katherine Mcguire is a 36 y.o. female presenting for left substernal chest pain that she describes as sharp, 7/10 and worse with lying down and worse with deep breathing.  The pain waxes and wanes.  No family history of early CAD.  She did have a virus roughly 2 weeks ago in which her palms and soles developed a vesicular rash, which she thinks she contracted from her you son who had the lesion in his mouth, hands and feet. She denies SOB, DOE< cough, leg swelling and posterior calf pain.  She does not smoke and does not take birth control.   She has No Known Allergies.   She  has a past medical history of Asthma and Preterm labor.    She  reports that she has never smoked. She does not have any smokeless tobacco history on file. She reports that she does not drink alcohol or use drugs. She  reports that she currently engages in sexual activity. The patient  has a past surgical history that includes Cesarean section and Dilation and evacuation (N/A, 01/21/2014).  Her family history includes Hypertension in her mother.  Review of Systems  Constitutional: Negative for diaphoresis.  HENT: Negative for sore throat.   Skin: Negative for rash.  Neurological: Negative for dizziness and headaches.    The problem list and medications were reviewed and updated by myself where necessary and exist elsewhere in the encounter.   OBJECTIVE:  BP 122/80   Pulse 91   Temp 98 F (36.7 C) (Oral)   Resp 18   Ht 5\' 5"  (1.651 m)   Wt 157 lb (71.2 kg)   SpO2 98%   BMI 26.13 kg/m   Physical Exam  Constitutional: She is oriented to person, place, and time. She appears well-nourished. No distress.  Eyes: EOM are normal. Pupils are equal, round, and reactive to light.  Cardiovascular: Normal rate, regular rhythm, normal heart sounds and intact distal pulses.  Exam reveals no friction rub.   Pulmonary/Chest: Effort normal. No respiratory  distress. She has no wheezes. She has no rales. She exhibits tenderness (left sub sternal).  Peak flow ~350 to 425 post neb.  Abdominal: She exhibits no distension.  Musculoskeletal: Normal range of motion. She exhibits no edema or tenderness.  Neurological: She is alert and oriented to person, place, and time. No cranial nerve deficit. Gait normal.  Skin: Skin is dry. She is not diaphoretic.  Psychiatric: She has a normal mood and affect.  Vitals reviewed.   Results for orders placed or performed in visit on 10/05/16 (from the past 72 hour(s))  POCT urine pregnancy     Status: None   Collection Time: 10/05/16  5:20 PM  Result Value Ref Range   Preg Test, Ur Negative Negative    No results found.  ASSESSMENT AND PLAN:  Vassie Momentminata was seen today for chest pain.  Diagnoses and all orders for this visit:  Other chest pain: Pain did improve somewhat with nebs however was still present.  I spoke to Dr. Nadara EatonGangi regarding her HPI and EKG.  He was reassured and felt she was very low likelihood for risk of tamponade secondary to a possible viral pericarditis.  He did advise that she start Naprosyn and will be happy to see her in clinic early next week as a walk in.  Appreciate his recommendations in this curbside consult.  -     EKG 12-Lead -  DG Chest 2 View; Future -     POCT urine pregnancy       -     naproxen (NAPROSYN) 500 MG tablet; Take 1 tablet (500 mg total) by mouth 2 (two)             times daily with a meal.  Abnormally low peak expiratory flow rate: She will try using her nebs more frequently in the next few days to see if her pain improves.  -     albuterol (PROVENTIL) (2.5 MG/3ML) 0.083% nebulizer solution 2.5 mg; Take 3 mLs (2.5 mg total) by nebulization once. -     ipratropium (ATROVENT) nebulizer solution 0.5 mg; Take 2.5 mLs (0.5 mg total) by nebulization once.       The patient is advised to call or return to clinic if she does not see an improvement in symptoms, or  to seek the care of the closest emergency department if she worsens with the above plan.   Deliah Boston, MHS, PA-C [facility] 10/07/2016 7:00 PM

## 2016-10-05 NOTE — Patient Instructions (Addendum)
I spoke to Dr. Nadara EatonGangi and he is reassured.  If you continue to have pain are are concerned he would like to see you in clinic at [address] [place] [address]. His office number is [phone] and he would see you as a walk in if needed.   Start taking Naprosyn for and take twice daily for the next five days.  Use your inhaler as needed.    IF you received an x-ray today, you will receive an invoice from [facility]. Please contact [facility] at [phone] with questions or concerns regarding your invoice.   IF you received labwork today, you will receive an invoice from [facility]. Please contact [facility] at [phone] with questions or concerns regarding your invoice.   Our billing staff will not be able to assist you with questions regarding bills from these companies.  You will be contacted with the lab results as soon as they are available. The fastest way to get your results is to activate your My Chart account. Instructions are located on the last page of this paperwork. If you have not heard from us regarding the results in 2 weeks, please contact this office.

## 2017-09-08 ENCOUNTER — Encounter (HOSPITAL_COMMUNITY): Payer: Self-pay | Admitting: Emergency Medicine

## 2017-09-08 ENCOUNTER — Emergency Department (HOSPITAL_COMMUNITY): Payer: Self-pay

## 2017-09-08 ENCOUNTER — Emergency Department (HOSPITAL_COMMUNITY)
Admission: EM | Admit: 2017-09-08 | Discharge: 2017-09-09 | Disposition: A | Discharge status: Home / Self Care | Payer: Self-pay | Attending: Emergency Medicine | Admitting: Emergency Medicine

## 2017-09-08 DIAGNOSIS — J45909 Unspecified asthma, uncomplicated: Secondary | ICD-10-CM | POA: Insufficient documentation

## 2017-09-08 DIAGNOSIS — Z79899 Other long term (current) drug therapy: Secondary | ICD-10-CM | POA: Insufficient documentation

## 2017-09-08 DIAGNOSIS — R51 Headache: Secondary | ICD-10-CM | POA: Insufficient documentation

## 2017-09-08 DIAGNOSIS — R519 Headache, unspecified: Secondary | ICD-10-CM

## 2017-09-08 MED ORDER — METOCLOPRAMIDE HCL 5 MG/ML IJ SOLN
10.0000 mg | Freq: Once | INTRAMUSCULAR | Status: AC
  Administered 2017-09-09: 10 mg via INTRAVENOUS
  Filled 2017-09-08: qty 2

## 2017-09-08 MED ORDER — SODIUM CHLORIDE 0.9 % IV BOLUS (SEPSIS)
1000.0000 mL | Freq: Once | INTRAVENOUS | Status: AC
  Administered 2017-09-08: 1000 mL via INTRAVENOUS

## 2017-09-08 MED ORDER — DIPHENHYDRAMINE HCL 50 MG/ML IJ SOLN
25.0000 mg | Freq: Once | INTRAMUSCULAR | Status: AC
  Administered 2017-09-08: 25 mg via INTRAVENOUS
  Filled 2017-09-08: qty 1

## 2017-09-08 MED ORDER — KETOROLAC TROMETHAMINE 30 MG/ML IJ SOLN
30.0000 mg | Freq: Once | INTRAMUSCULAR | Status: AC
  Administered 2017-09-09: 30 mg via INTRAVENOUS
  Filled 2017-09-08: qty 1

## 2017-09-08 MED ORDER — DEXAMETHASONE SODIUM PHOSPHATE 10 MG/ML IJ SOLN
10.0000 mg | Freq: Once | INTRAMUSCULAR | Status: AC
  Administered 2017-09-09: 10 mg via INTRAVENOUS
  Filled 2017-09-08: qty 1

## 2017-09-08 NOTE — ED Triage Notes (Signed)
Pt from home with generalized headache. Pt states pain has been 10/10 for 3 days. Pt denies n/v, light sensitivity, or alterations in vision. Pt has no deficits and no hx of migraines.

## 2017-09-08 NOTE — ED Provider Notes (Signed)
[facility]-EMERGENCY DEPT Provider Note   CSN: [id number] Arrival date & time: 09/08/17  2120     History   Chief Complaint Chief Complaint  Patient presents with  . Headache    HPI Katherine Mcguire is a 36 y.o. female.  Patient is a 36 year old female with no significant past medical history presenting for evaluation of headache.  She reports this is been ongoing for the past 4 days.  It began in the absence of any injury or trauma.  She reports that is a pressure to the left parietal region that is constant in nature.  She denies any visual disturbances, nausea, weakness, numbness, or tingling.  She has tried taking aspirin with little relief.   The history is provided by the patient.  Headache   This is a new problem. Episode onset: 4 days ago. The problem occurs constantly. The problem has not changed since onset.The headache is associated with nothing. The pain is located in the parietal region. The pain is severe. The pain does not radiate. Pertinent negatives include no fever, no malaise/fatigue, no nausea and no vomiting. She has tried aspirin for the symptoms. The treatment provided no relief.    Past Medical History:  Diagnosis Date  . Asthma   . Preterm labor     There are no active problems to display for this patient.   Past Surgical History:  Procedure Laterality Date  . CESAREAN SECTION    . DILATION AND EVACUATION N/A 01/21/2014   Procedure: DILATATION AND EVACUATION;  Surgeon: Lesly DukesKelly H Leggett, MD;  Location: [facility];  Service: Gynecology;  Laterality: N/A;    OB History    Gravida Para Term Preterm AB Living   6 5 3 2 1 5    SAB TAB Ectopic Multiple Live Births   0 1   0 5       Home Medications    Prior to Admission medications   Medication Sig Start Date End Date Taking? Authorizing Provider  naproxen (NAPROSYN) 500 MG tablet Take 1 tablet (500 mg total) by mouth 2 (two) times daily with a meal. 10/05/16   Ofilia Neaslark, Michael L,  PA-C    Family History Family History  Problem Relation Age of Onset  . Hypertension Mother     Social History Social History   Tobacco Use  . Smoking status: Never Smoker  Substance Use Topics  . Alcohol use: No  . Drug use: No     Allergies   Patient has no known allergies.   Review of Systems Review of Systems  Constitutional: Negative for fever and malaise/fatigue.  Gastrointestinal: Negative for nausea and vomiting.  Neurological: Positive for headaches.  All other systems reviewed and are negative.    Physical Exam Updated Vital Signs BP (!) 126/91 (BP Location: Left Arm)   Pulse 100   Temp 98.3 F (36.8 C) (Oral)   Resp 18   SpO2 100%   Physical Exam  Constitutional: She is oriented to person, place, and time. She appears well-developed and well-nourished. No distress.  HENT:  Head: Normocephalic and atraumatic.  Neck: Normal range of motion. Neck supple.  Cardiovascular: Normal rate and regular rhythm. Exam reveals no gallop and no friction rub.  No murmur heard. Pulmonary/Chest: Effort normal and breath sounds normal. No respiratory distress. She has no wheezes.  Abdominal: Soft. Bowel sounds are normal. She exhibits no distension. There is no tenderness.  Musculoskeletal: Normal range of motion.  Neurological: She is alert and oriented  to person, place, and time. She has normal strength. She is not disoriented. No cranial nerve deficit. She displays a negative Romberg sign. Coordination and gait normal.  Skin: Skin is warm and dry. She is not diaphoretic.  Nursing note and vitals reviewed.    ED Treatments / Results  Labs (all labs ordered are listed, but only abnormal results are displayed) Labs Reviewed  I-STAT BETA HCG BLOOD, ED ([facility], [facility], [facility] ONLY)    EKG  EKG Interpretation None       Radiology No results found.  Procedures Procedures (including critical care time)  Medications Ordered in ED Medications  sodium chloride 0.9 %  bolus 1,000 mL (not administered)  ketorolac (TORADOL) 30 MG/ML injection 30 mg (not administered)  dexamethasone (DECADRON) injection 10 mg (not administered)  diphenhydrAMINE (BENADRYL) injection 25 mg (not administered)  metoCLOPramide (REGLAN) injection 10 mg (not administered)     Initial Impression / Assessment and Plan / ED Course  I have reviewed the triage vital signs and the nursing notes.  Pertinent labs & imaging results that were available during my care of the patient were reviewed by me and considered in my medical decision making (see chart for details).  Patient's neurologic exam is nonfocal and head CT is negative.  She is feeling better after a migraine cocktail.  She will be discharged with rotating Tylenol and ibuprofen and follow-up as needed.  Final Clinical Impressions(s) / ED Diagnoses   Final diagnoses:  None    ED Discharge Orders    None       Geoffery Lyonselo, Adaliz Dobis, MD 09/09/17 [phone]

## 2017-09-09 LAB — I-STAT BETA HCG BLOOD, ED (MC, WL, AP ONLY): I-stat hCG, quantitative: <5 m[IU]/mL (ref <5)

## 2017-09-09 NOTE — Discharge Instructions (Signed)
Tylenol 1000 mg rotated with Ibuprofen 600 mg every 4 hours as needed for pain.  Follow up with primary doctor if symptoms not improving in the next week.

## 2018-04-25 ENCOUNTER — Encounter (HOSPITAL_COMMUNITY): Payer: Self-pay | Admitting: Emergency Medicine

## 2018-04-25 ENCOUNTER — Emergency Department (HOSPITAL_COMMUNITY)
Admission: EM | Admit: 2018-04-25 | Discharge: 2018-04-25 | Disposition: A | Discharge status: Home / Self Care | Payer: BLUE CROSS/BLUE SHIELD | Attending: Emergency Medicine | Admitting: Emergency Medicine

## 2018-04-25 DIAGNOSIS — Z5321 Procedure and treatment not carried out due to patient leaving prior to being seen by health care provider: Secondary | ICD-10-CM | POA: Insufficient documentation

## 2018-04-25 DIAGNOSIS — R51 Headache: Secondary | ICD-10-CM | POA: Insufficient documentation

## 2018-04-25 NOTE — ED Notes (Signed)
Name called multiple times for vitals and room assignment with no answer

## 2018-04-25 NOTE — ED Triage Notes (Signed)
Pt presents to eD for assessment of posterior headache x 1 week with no relief from Tylenol at home.  Pt states she has been eating less salt, thinking it was her BP.  Denies vomiting, c/o nausea, c.o sound sensitivity, denies light.

## 2018-04-28 NOTE — ED Notes (Signed)
Follow up call made  No answer  04/28/18  0914  s Osamu Olguin rn

## 2019-01-20 ENCOUNTER — Inpatient Hospital Stay (HOSPITAL_COMMUNITY)
Admission: AD | Admit: 2019-01-20 | Discharge: 2019-01-20 | Disposition: A | Discharge status: Home / Self Care | Payer: Self-pay | Attending: Emergency Medicine | Admitting: Emergency Medicine

## 2019-01-20 ENCOUNTER — Other Ambulatory Visit: Payer: Self-pay

## 2019-01-20 ENCOUNTER — Encounter (HOSPITAL_COMMUNITY): Payer: Self-pay | Admitting: Emergency Medicine

## 2019-01-20 ENCOUNTER — Inpatient Hospital Stay (HOSPITAL_COMMUNITY): Payer: Self-pay

## 2019-01-20 DIAGNOSIS — Z975 Presence of (intrauterine) contraceptive device: Secondary | ICD-10-CM | POA: Insufficient documentation

## 2019-01-20 DIAGNOSIS — Z79899 Other long term (current) drug therapy: Secondary | ICD-10-CM | POA: Insufficient documentation

## 2019-01-20 DIAGNOSIS — R109 Unspecified abdominal pain: Secondary | ICD-10-CM

## 2019-01-20 DIAGNOSIS — Z3202 Encounter for pregnancy test, result negative: Secondary | ICD-10-CM

## 2019-01-20 DIAGNOSIS — M549 Dorsalgia, unspecified: Secondary | ICD-10-CM | POA: Insufficient documentation

## 2019-01-20 DIAGNOSIS — R319 Hematuria, unspecified: Secondary | ICD-10-CM | POA: Insufficient documentation

## 2019-01-20 DIAGNOSIS — R1030 Lower abdominal pain, unspecified: Secondary | ICD-10-CM | POA: Insufficient documentation

## 2019-01-20 DIAGNOSIS — J45909 Unspecified asthma, uncomplicated: Secondary | ICD-10-CM | POA: Insufficient documentation

## 2019-01-20 LAB — I-STAT BETA HCG BLOOD, ED (MC, WL, AP ONLY): I-stat hCG, quantitative: <5 m[IU]/mL (ref <5)

## 2019-01-20 LAB — COMPREHENSIVE METABOLIC PANEL
ALT: 26 U/L (ref 0–44)
AST: 18 U/L (ref 15–41)
Albumin: 4.3 g/dL (ref 3.5–5.0)
Alkaline Phosphatase: 60 U/L (ref 38–126)
Anion gap: 10 (ref 5–15)
BUN: 11 mg/dL (ref 6–20)
CO2: 27 mmol/L (ref 22–32)
Calcium: 9.8 mg/dL (ref 8.9–10.3)
Chloride: 102 mmol/L (ref 98–111)
Creatinine, Ser: 0.76 mg/dL (ref 0.44–1.00)
GFR calc Af Amer: >60 mL/min (ref >60)
GFR calc non Af Amer: >60 mL/min (ref >60)
Glucose, Bld: 98 mg/dL (ref 70–99)
Potassium: 4.3 mmol/L (ref 3.5–5.1)
Sodium: 139 mmol/L (ref 135–145)
Total Bilirubin: 0.4 mg/dL (ref 0.3–1.2)
Total Protein: 7.6 g/dL (ref 6.5–8.1)

## 2019-01-20 LAB — CBC
HCT: 41.6 % (ref 36.0–46.0)
Hemoglobin: 13.7 g/dL (ref 12.0–15.0)
MCH: 30.4 pg (ref 26.0–34.0)
MCHC: 32.9 g/dL (ref 30.0–36.0)
MCV: 92.2 fL (ref 80.0–100.0)
Platelets: 308 10*3/uL (ref 150–400)
RBC: 4.51 MIL/uL (ref 3.87–5.11)
RDW: 12.4 % (ref 11.5–15.5)
WBC: 5.4 10*3/uL (ref 4.0–10.5)
nRBC: 0 % (ref 0.0–0.2)

## 2019-01-20 LAB — URINALYSIS, ROUTINE W REFLEX MICROSCOPIC
Bilirubin Urine: NEGATIVE
Glucose, UA: NEGATIVE mg/dL
Hgb urine dipstick: NEGATIVE
Ketones, ur: NEGATIVE mg/dL
Leukocytes,Ua: NEGATIVE
Nitrite: NEGATIVE
Protein, ur: NEGATIVE mg/dL
Specific Gravity, Urine: 1.016 (ref 1.005–1.030)
pH: 7 (ref 5.0–8.0)

## 2019-01-20 LAB — LIPASE, BLOOD: Lipase: 42 U/L (ref 11–51)

## 2019-01-20 MED ORDER — CYCLOBENZAPRINE HCL 10 MG PO TABS: 10.0000 mg | ORAL_TABLET | Freq: Two times a day (BID) | ORAL | 0 refills | Status: AC | PRN

## 2019-01-20 MED ORDER — ETODOLAC 300 MG PO CAPS: 300.0000 mg | ORAL_CAPSULE | Freq: Three times a day (TID) | ORAL | 0 refills | Status: AC

## 2019-01-20 MED ORDER — KETOROLAC TROMETHAMINE 30 MG/ML IJ SOLN
30.0000 mg | Freq: Once | INTRAMUSCULAR | Status: AC
  Administered 2019-01-20: 30 mg via INTRAMUSCULAR
  Filled 2019-01-20: qty 1

## 2019-01-20 NOTE — ED Notes (Signed)
Pt discharged from ED; instructions provided and scripts given; Pt encouraged to return to ED if symptoms worsen and to f/u with PCP; Pt verbalized understanding of all instructions 

## 2019-01-20 NOTE — MAU Note (Signed)
Since Friday, been having a stomach ache, Sat night- back started hurting.  Was drinking hot stuff, taking Ibuprofen using heating pad. Almost passed out today.  Does not believe she is pregnant.  Has had IUD for 4 yrs, does not have periods.  Yesterday when she urinated, she saw blood.

## 2019-01-20 NOTE — ED Triage Notes (Addendum)
Pt originally went to MAU.     Pt presents with bilateral lower abd pain and back pain since Friday. Pt denies any trouble urinating but does endorse some spots of blood in her urine.

## 2019-01-20 NOTE — ED Provider Notes (Signed)
[facility] EMERGENCY DEPARTMENT Provider Note   CSN: [id number] Arrival date & time: 01/20/19  1748    History   Chief Complaint Chief Complaint  Patient presents with   Abdominal Pain   Back Pain    HPI Katherine Mcguire is a 38 y.o. female.     HPI Patient states she started having lower abdominal pain on Friday.  It was a cramping discomfort in her lower abdomen.  Patient symptoms have persisted and worsened over the weekend.  Today the symptoms were more intense and she was experiencing pain up in her right flank.  Patient denies any nausea or vomiting.  She has not had any diarrhea.  She denies any vaginal bleeding or discharge.  She has noticed some blood in her urine.  Patient was initially seen at maternity admissions.  Had a negative pregnancy test and was sent down to the ED. Past Medical History:  Diagnosis Date   Asthma    Preterm labor     There are no active problems to display for this patient.   Past Surgical History:  Procedure Laterality Date   CESAREAN SECTION     DILATION AND EVACUATION N/A 01/21/2014   Procedure: DILATATION AND EVACUATION;  Surgeon: Lesly Dukes, MD;  Location: [facility];  Service: Gynecology;  Laterality: N/A;     OB History    Gravida  6   Para  5   Term  3   Preterm  2   AB  1   Living  5     SAB  0   TAB  1   Ectopic      Multiple  0   Live Births  5            Home Medications    Prior to Admission medications   Medication Sig Start Date End Date Taking? Authorizing Provider  cyclobenzaprine (FLEXERIL) 10 MG tablet Take 1 tablet (10 mg total) by mouth 2 (two) times daily as needed for muscle spasms. 01/20/19   Linwood Dibbles, MD  etodolac (LODINE) 300 MG capsule Take 1 capsule (300 mg total) by mouth every 8 (eight) hours. 01/20/19   Linwood Dibbles, MD    Family History Family History  Problem Relation Age of Onset   Hypertension Mother     Social History Social History    Tobacco Use   Smoking status: Never Smoker   Smokeless tobacco: Never Used  Substance Use Topics   Alcohol use: No   Drug use: No     Allergies   Patient has no known allergies.   Review of Systems Review of Systems  All other systems reviewed and are negative.    Physical Exam Updated Vital Signs BP 117/79    Pulse 89    Temp 99 F (37.2 C) (Oral)    Resp 17    Ht 1.651 m (5\' 5" )    Wt 71.2 kg    LMP  (Exact Date)    SpO2 100%    BMI 26.13 kg/m   Physical Exam Vitals signs and nursing note reviewed.  Constitutional:      General: She is not in acute distress.    Appearance: She is well-developed.  HENT:     Head: Normocephalic and atraumatic.     Right Ear: External ear normal.     Left Ear: External ear normal.  Eyes:     General: No scleral icterus.       Right eye:  No discharge.        Left eye: No discharge.     Conjunctiva/sclera: Conjunctivae normal.  Neck:     Musculoskeletal: Neck supple.     Trachea: No tracheal deviation.  Cardiovascular:     Rate and Rhythm: Normal rate and regular rhythm.  Pulmonary:     Effort: Pulmonary effort is normal. No respiratory distress.     Breath sounds: Normal breath sounds. No stridor. No wheezing or rales.  Abdominal:     General: Bowel sounds are normal. There is no distension.     Palpations: Abdomen is soft.     Tenderness: There is no abdominal tenderness. There is right CVA tenderness. There is no guarding or rebound.  Musculoskeletal:        General: No tenderness.  Skin:    General: Skin is warm and dry.     Findings: No rash.  Neurological:     Mental Status: She is alert.     Cranial Nerves: No cranial nerve deficit (no facial droop, extraocular movements intact, no slurred speech).     Sensory: No sensory deficit.     Motor: No abnormal muscle tone or seizure activity.     Coordination: Coordination normal.      ED Treatments / Results  Labs (all labs ordered are listed, but only  abnormal results are displayed) Labs Reviewed  URINALYSIS, ROUTINE W REFLEX MICROSCOPIC - Abnormal; Notable for the following components:      Result Value   APPearance HAZY (*)    All other components within normal limits  LIPASE, BLOOD  COMPREHENSIVE METABOLIC PANEL  CBC  I-STAT BETA HCG BLOOD, ED ([facility], [facility], [facility] ONLY)    EKG None  Radiology Ct Renal Stone Study  Result Date: 01/20/2019 CLINICAL DATA:  Initial evaluation for acute bilateral lower abdominal pain and flank pain. EXAM: CT ABDOMEN AND PELVIS WITHOUT CONTRAST TECHNIQUE: Multidetector CT imaging of the abdomen and pelvis was performed following the standard protocol without IV contrast. COMPARISON:  Prior ultrasound from 11/14/2014. FINDINGS: Lower chest: Visualized lung bases are clear. Hepatobiliary: Limited noncontrast evaluation of the liver is unremarkable. Gallbladder within normal limits. No biliary dilatation. Pancreas: Pancreas within normal limits. Spleen: Spleen within normal limits. Adrenals/Urinary Tract: Adrenal glands are normal. Kidneys equal in size without evidence for nephrolithiasis or hydronephrosis. No radiopaque calculi seen along the course of either renal collecting system. No hydroureter. Partially distended bladder within normal limits. No layering stones within the bladder lumen. Stomach/Bowel: Stomach within normal limits. No evidence for bowel obstruction. Normal appendix. No acute inflammatory changes seen about the bowels. Vascular/Lymphatic: Intra-abdominal aorta of normal caliber. No adenopathy. Reproductive: IUD in place within the uterus. Uterus and ovaries otherwise unremarkable for age. Other: No free air or fluid. Musculoskeletal: Mild diffuse hazy soft tissue stranding within the subcutaneous fat of the anterior abdominal wall, nonspecific, but may be postsurgical. No acute osseous abnormality. No discrete lytic or blastic osseous lesions. IMPRESSION: 1. No CT evidence for nephrolithiasis,  ureterolithiasis, or obstructive uropathy. 2. No other acute intra-abdominal or pelvic process identified. Electronically Signed   By: Rise MuBenjamin  McClintock M.D.   On: 01/20/2019 22:21    Procedures Procedures (including critical care time)  Medications Ordered in ED Medications  ketorolac (TORADOL) 30 MG/ML injection 30 mg (has no administration in time range)     Initial Impression / Assessment and Plan / ED Course  I have reviewed the triage vital signs and the nursing notes.  Pertinent labs & imaging  results that were available during my care of the patient were reviewed by me and considered in my medical decision making (see chart for details).  Clinical Course as of Jan 20 2336  Tue Jan 20, 2019  2211 Initial labs are unremarkable.  Considering her complaints of hematuria and right flank pain will plan on CT scan   [JK]    Clinical Course User Index [JK] Linwood Dibbles, MD     Patient presented with complaints of lower abdominal pain and flank pain.  On exam she has no tenderness.  Patient did have some tenderness in the right flank area.  Laboratory tests are all normal.  CT scan was performed to evaluate for the possibility of kidney stone.  CT scan is negative.  No findings to suggest appendicitis.  Patient is not having any genitourinary complaints to suggest pelvic infection.  Plan to discharge with NSAIDs as well as symptoms.  Stable for outpatient follow-up.  Final Clinical Impressions(s) / ED Diagnoses   Final diagnoses:  Abdominal pain, unspecified abdominal location  Pregnancy test negative  Flank pain    ED Discharge Orders         Ordered    etodolac (LODINE) 300 MG capsule  Every 8 hours    Note to Pharmacy:  As needed for pain   01/20/19 2337    cyclobenzaprine (FLEXERIL) 10 MG tablet  2 times daily PRN     01/20/19 2337           Linwood Dibbles, MD 01/20/19 2338

## 2019-01-20 NOTE — ED Notes (Signed)
Signature pad not working. 

## 2019-01-20 NOTE — MAU Provider Note (Signed)
First Provider Initiated Contact with Patient 01/20/19 1911      S [name] is a 39 y.o. [id number] non-pregnant female who presents to MAU today with complaint of abdominal and back pain, and VB.   O BP 114/74 (BP Location: Right Arm)   Pulse (!) 108   Temp 98.6 F (37 C) (Oral)   Resp 16   Wt 71.3 kg   SpO2 100%   BMI 26.16 kg/m  Physical Exam  Constitutional: She is oriented to person, place, and time. She appears well-developed and well-nourished. No distress.  HENT:  Head: Normocephalic and atraumatic.  Neck: Normal range of motion.  Cardiovascular: Normal rate.  Respiratory: Effort normal. No respiratory distress.  Musculoskeletal: Normal range of motion.  Neurological: She is alert and oriented to person, place, and time.  Psychiatric: She has a normal mood and affect.   UPT negative  No results found for this or any previous visit (from the past 24 hour(s)).  A Non pregnant female Medical screening exam complete   P Transfer to [facility] for further evaluation  Patient may return to MAU as needed for pregnancy related complaints  Donette Larry, [place] 01/20/2019 7:16 PM

## 2019-01-20 NOTE — Discharge Instructions (Addendum)
Medications as prescribed, follow-up with your doctor to be rechecked if the symptoms have not resolved.

## 2019-01-22 LAB — POC URINE PREG, ED: Preg Test, Ur: NEGATIVE

## 2020-06-07 IMAGING — CT CT RENAL STONE PROTOCOL
2 of 3 series · 16 of 46 positions shown, 18 images · non-contrast
Comparison: Prior ultrasound from 11/14/2014.

CLINICAL DATA: Initial evaluation for acute bilateral lower
abdominal pain and flank pain.

EXAM:
CT ABDOMEN AND PELVIS WITHOUT CONTRAST
TECHNIQUE: Multidetector CT imaging of the abdomen and pelvis was performed
following the standard protocol without IV contrast.

[Series 3: renal stone 5.0 · axial · 0.88mm/px · z∈[+441,+871]mm · 13 of 100 slices shown, 15 images]
[im 7/100  soft-tissue]
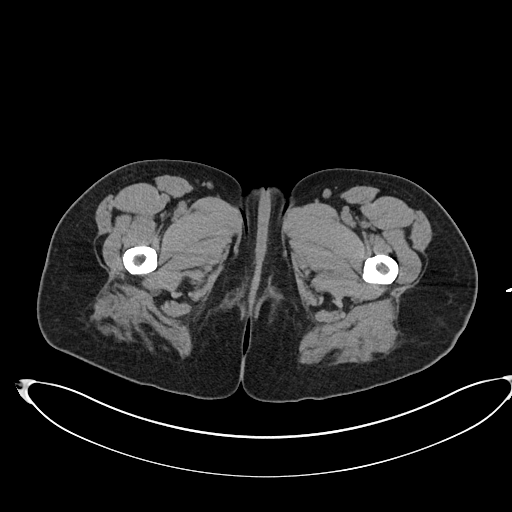
[im 7/100  bone]
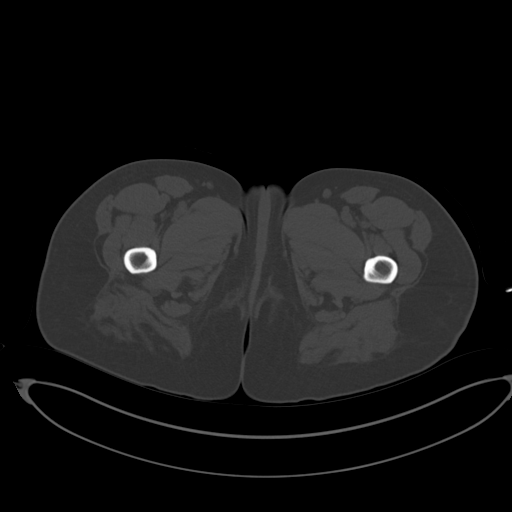
[im 13/100  soft-tissue]
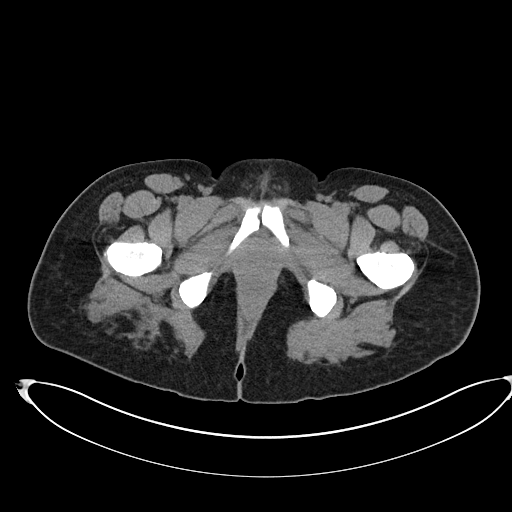
[im 20/100  soft-tissue]
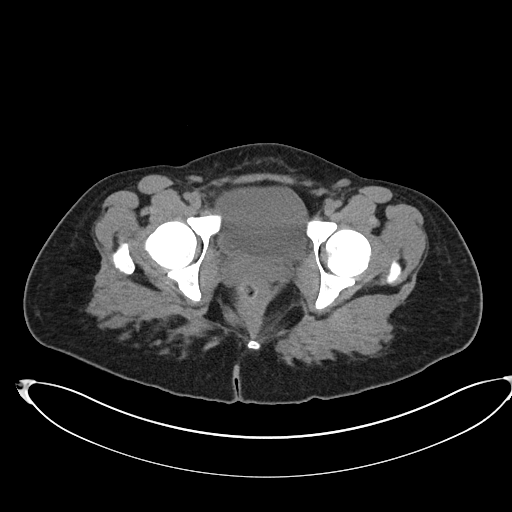
[im 29/100  soft-tissue]
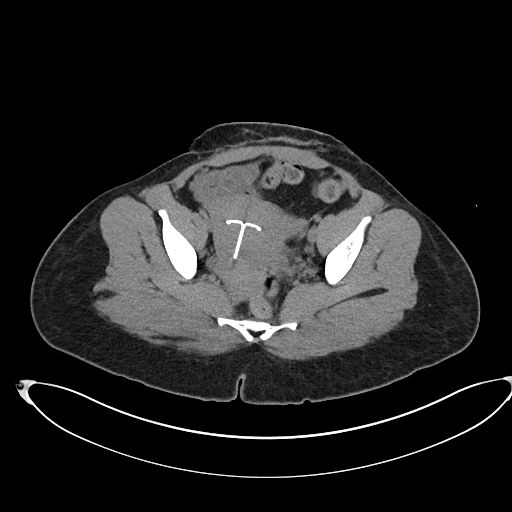
[im 36/100  soft-tissue]
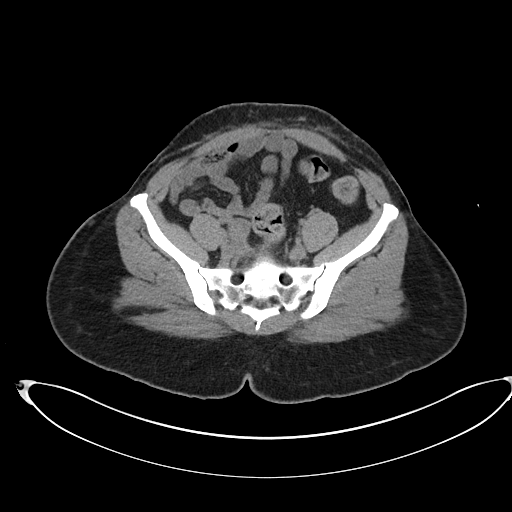
[im 42/100  soft-tissue]
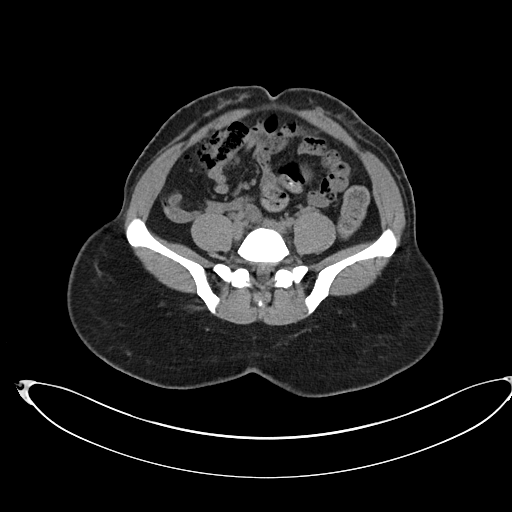
[im 52/100  soft-tissue]
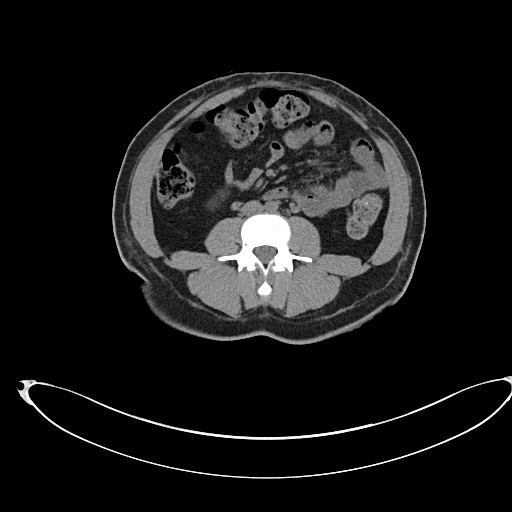
[im 58/100  soft-tissue]
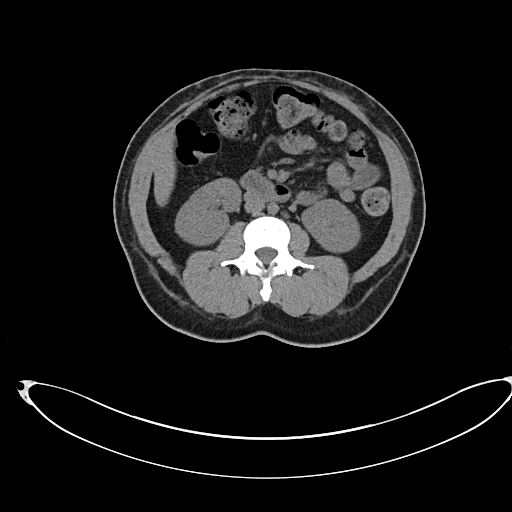
[im 64/100  soft-tissue]
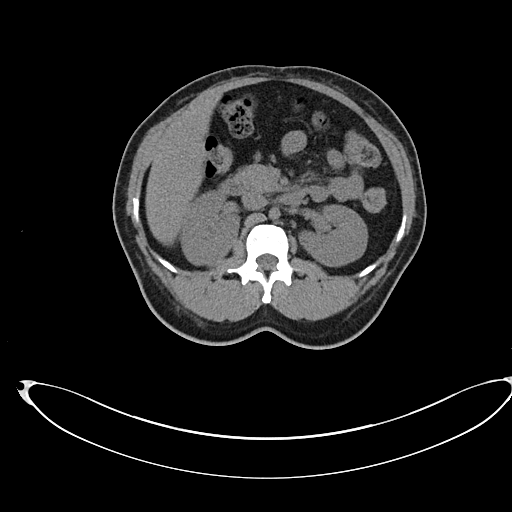
[im 64/100  bone]
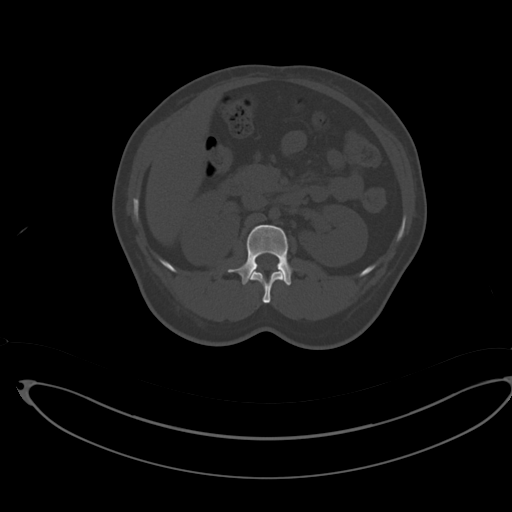
[im 71/100  soft-tissue]
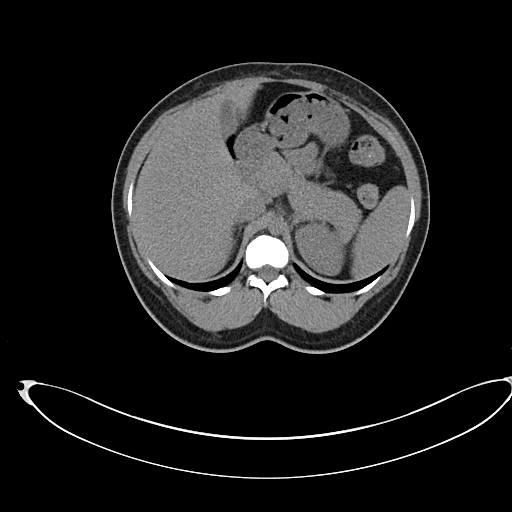
[im 80/100  soft-tissue]
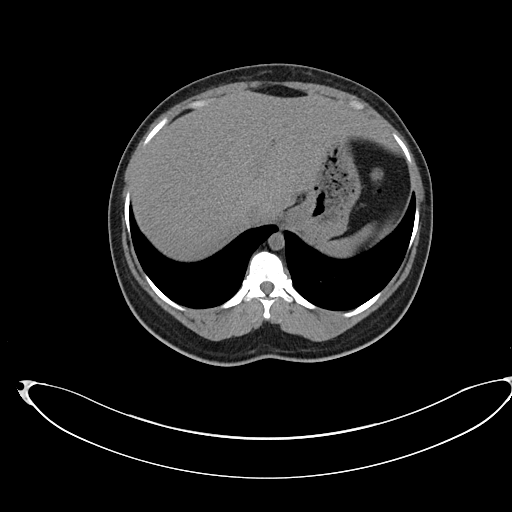
[im 87/100  soft-tissue]
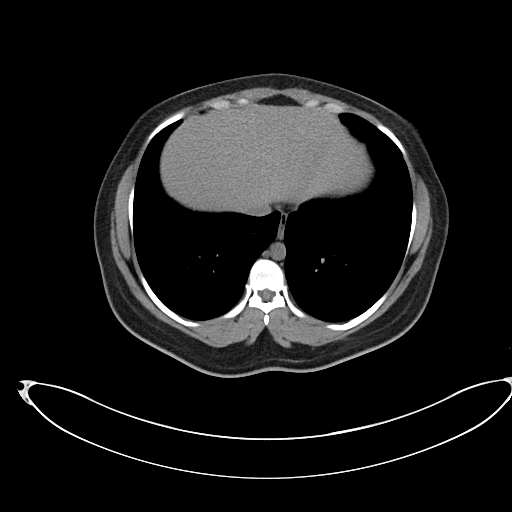
[im 93/100  soft-tissue]
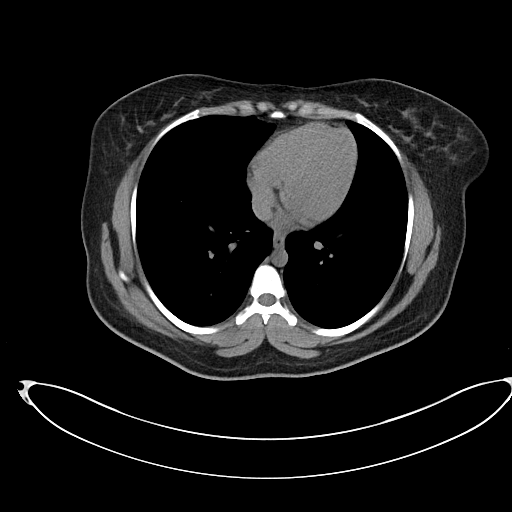

[Series 5: renal stone 3.0 cor · coronal · 0.84mm/px · 3 of 110 slices shown]
[im 37/110  soft-tissue]
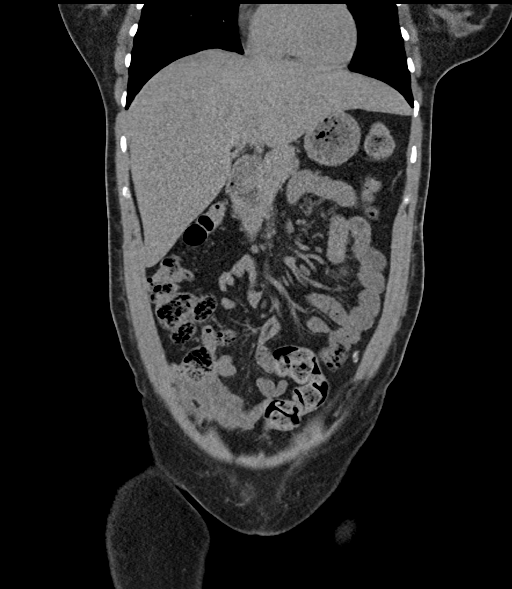
[im 49/110  soft-tissue]
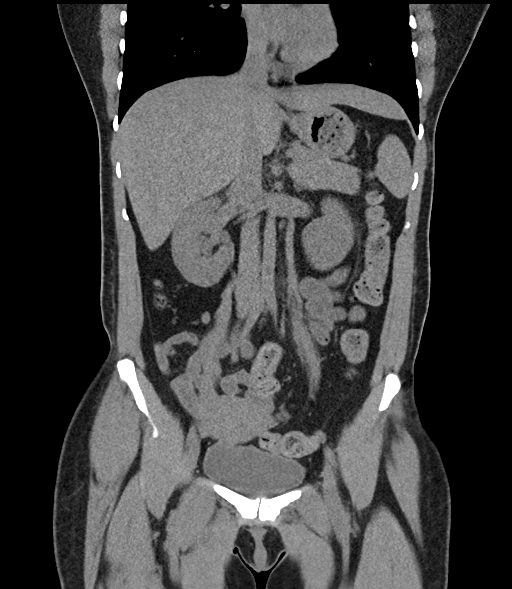
[im 61/110  soft-tissue]
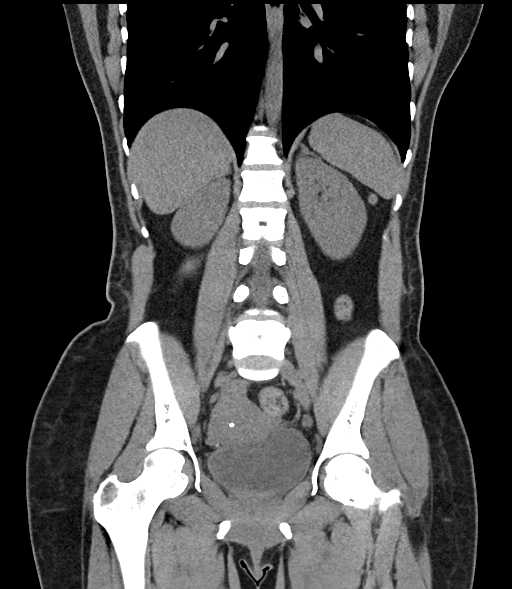

[16 of 46 positions shown; findings below may reference images not displayed]

FINDINGS: Lower chest: Visualized lung bases are clear.

Hepatobiliary: Limited noncontrast evaluation of the liver is
unremarkable. Gallbladder within normal limits. No biliary
dilatation.

Pancreas: Pancreas within normal limits.

Spleen: Spleen within normal limits.

Adrenals/Urinary Tract: Adrenal glands are normal. Kidneys equal in
size without evidence for nephrolithiasis or hydronephrosis. No
radiopaque calculi seen along the course of either renal collecting
system. No hydroureter. Partially distended bladder within normal
limits. No layering stones within the bladder lumen.

Stomach/Bowel: Stomach within normal limits. No evidence for bowel
obstruction. Normal appendix. No acute inflammatory changes seen
about the bowels.

Vascular/Lymphatic: Intra-abdominal aorta of normal caliber. No
adenopathy.

Reproductive: IUD in place within the uterus. Uterus and ovaries
otherwise unremarkable for age.

Other: No free air or fluid.

Musculoskeletal: Mild diffuse hazy soft tissue stranding within the
subcutaneous fat of the anterior abdominal wall, nonspecific, but
may be postsurgical. No acute osseous abnormality. No discrete lytic
or blastic osseous lesions.
IMPRESSION: 1. No CT evidence for nephrolithiasis, ureterolithiasis, or
obstructive uropathy.
2. No other acute intra-abdominal or pelvic process identified.
